# Patient Record
Sex: Male | Born: 1955 | Race: White | Hispanic: No | Smoking: Never smoker
Health system: Southern US, Community
[De-identification: ages and names within clinical notes are randomized; demographics above are authoritative.]

## PROBLEM LIST (undated history)

## (undated) DIAGNOSIS — M1990 Unspecified osteoarthritis, unspecified site: Secondary | ICD-10-CM

## (undated) DIAGNOSIS — K449 Diaphragmatic hernia without obstruction or gangrene: Secondary | ICD-10-CM

## (undated) DIAGNOSIS — E119 Type 2 diabetes mellitus without complications: Secondary | ICD-10-CM

## (undated) DIAGNOSIS — F419 Anxiety disorder, unspecified: Secondary | ICD-10-CM

## (undated) DIAGNOSIS — K225 Diverticulum of esophagus, acquired: Secondary | ICD-10-CM

## (undated) DIAGNOSIS — D509 Iron deficiency anemia, unspecified: Secondary | ICD-10-CM

## (undated) DIAGNOSIS — I10 Essential (primary) hypertension: Secondary | ICD-10-CM

## (undated) DIAGNOSIS — C801 Malignant (primary) neoplasm, unspecified: Secondary | ICD-10-CM

## (undated) DIAGNOSIS — K219 Gastro-esophageal reflux disease without esophagitis: Secondary | ICD-10-CM

## (undated) DIAGNOSIS — K5730 Diverticulosis of large intestine without perforation or abscess without bleeding: Secondary | ICD-10-CM

## (undated) DIAGNOSIS — B029 Zoster without complications: Secondary | ICD-10-CM

## (undated) DIAGNOSIS — E785 Hyperlipidemia, unspecified: Secondary | ICD-10-CM

## (undated) DIAGNOSIS — R7303 Prediabetes: Secondary | ICD-10-CM

## (undated) HISTORY — DX: Unspecified osteoarthritis, unspecified site: M19.90

## (undated) HISTORY — DX: Essential (primary) hypertension: I10

## (undated) HISTORY — DX: Diverticulum of esophagus, acquired: K22.5

## (undated) HISTORY — DX: Gastro-esophageal reflux disease without esophagitis: K21.9

## (undated) HISTORY — DX: Prediabetes: R73.03

## (undated) HISTORY — PX: COLONOSCOPY: SHX174

## (undated) HISTORY — DX: Diaphragmatic hernia without obstruction or gangrene: K44.9

## (undated) HISTORY — DX: Malignant (primary) neoplasm, unspecified: C80.1

## (undated) HISTORY — DX: Zoster without complications: B02.9

## (undated) HISTORY — DX: Diverticulosis of large intestine without perforation or abscess without bleeding: K57.30

## (undated) HISTORY — DX: Anxiety disorder, unspecified: F41.9

## (undated) HISTORY — DX: Iron deficiency anemia, unspecified: D50.9

## (undated) HISTORY — PX: ESOPHAGOGASTRODUODENOSCOPY: SHX1529

## (undated) HISTORY — PX: MEDIAL COLLATERAL LIGAMENT AND LATERAL COLLATERAL LIGAMENT REPAIR, KNEE: SHX2017

## (undated) HISTORY — DX: Hyperlipidemia, unspecified: E78.5

---

## 1998-05-09 ENCOUNTER — Ambulatory Visit (HOSPITAL_COMMUNITY): Admission: RE | Admit: 1998-05-09 | Discharge: 1998-05-09 | Payer: Self-pay | Admitting: *Deleted

## 1998-05-11 ENCOUNTER — Ambulatory Visit (HOSPITAL_COMMUNITY): Admission: RE | Admit: 1998-05-11 | Discharge: 1998-05-11 | Payer: Self-pay | Admitting: *Deleted

## 1998-09-27 ENCOUNTER — Ambulatory Visit (HOSPITAL_COMMUNITY): Admission: RE | Admit: 1998-09-27 | Discharge: 1998-09-27 | Payer: Self-pay | Admitting: Internal Medicine

## 1998-11-03 ENCOUNTER — Ambulatory Visit (HOSPITAL_COMMUNITY): Admission: RE | Admit: 1998-11-03 | Discharge: 1998-11-03 | Payer: Self-pay | Admitting: *Deleted

## 1998-11-03 ENCOUNTER — Encounter: Payer: Self-pay | Admitting: *Deleted

## 2000-06-12 ENCOUNTER — Ambulatory Visit (HOSPITAL_COMMUNITY): Admission: RE | Admit: 2000-06-12 | Discharge: 2000-06-12 | Payer: Self-pay | Admitting: *Deleted

## 2000-06-12 ENCOUNTER — Encounter: Payer: Self-pay | Admitting: *Deleted

## 2006-05-01 ENCOUNTER — Encounter: Payer: Self-pay | Admitting: *Deleted

## 2007-01-21 ENCOUNTER — Ambulatory Visit: Payer: Self-pay | Admitting: Gastroenterology

## 2007-01-21 LAB — CONVERTED CEMR LAB
Basophils Absolute: 0 10*3/uL (ref 0.0–0.1)
Basophils Relative: 0.7 % (ref 0.0–1.0)
Eosinophils Relative: 2.8 % (ref 0.0–5.0)
Folate: 7.7 ng/mL
HCT: 33.5 % — ABNORMAL LOW (ref 39.0–52.0)
Hemoglobin: 10.7 g/dL — ABNORMAL LOW (ref 13.0–17.0)
Iron: 18 ug/dL — ABNORMAL LOW (ref 42–165)
Lymphocytes Relative: 22.2 % (ref 12.0–46.0)
MCHC: 32 g/dL (ref 30.0–36.0)
MCV: 69.6 fL — ABNORMAL LOW (ref 78.0–100.0)
Monocytes Absolute: 0.7 10*3/uL (ref 0.2–0.7)
Monocytes Relative: 13.5 % — ABNORMAL HIGH (ref 3.0–11.0)
Neutro Abs: 3.1 10*3/uL (ref 1.4–7.7)
Neutrophils Relative %: 60.8 % (ref 43.0–77.0)
Platelets: 320 10*3/uL (ref 150–400)
RBC: 4.81 M/uL (ref 4.22–5.81)
RDW: 16.7 % — ABNORMAL HIGH (ref 11.5–14.6)
Tissue Transglutaminase Ab, IgA: <3 units (ref <5)
Vitamin B-12: 267 pg/mL (ref 211–911)
WBC: 5 10*3/uL (ref 4.5–10.5)

## 2007-01-22 ENCOUNTER — Encounter (INDEPENDENT_AMBULATORY_CARE_PROVIDER_SITE_OTHER): Payer: Self-pay | Admitting: Specialist

## 2007-01-22 ENCOUNTER — Ambulatory Visit: Payer: Self-pay | Admitting: Gastroenterology

## 2007-01-24 ENCOUNTER — Ambulatory Visit: Payer: Self-pay | Admitting: Gastroenterology

## 2007-01-24 LAB — CONVERTED CEMR LAB
Basophils Relative: 0.9 % (ref 0.0–1.0)
Monocytes Relative: 14.8 % — ABNORMAL HIGH (ref 3.0–11.0)
Neutro Abs: 2 10*3/uL (ref 1.4–7.7)
Platelets: 307 10*3/uL (ref 150–400)
RDW: 17 % — ABNORMAL HIGH (ref 11.5–14.6)

## 2007-02-03 ENCOUNTER — Ambulatory Visit: Payer: Self-pay | Admitting: Gastroenterology

## 2007-02-03 LAB — CONVERTED CEMR LAB
OCCULT 1: NEGATIVE
OCCULT 2: NEGATIVE
OCCULT 4: NEGATIVE
OCCULT 5: NEGATIVE

## 2007-02-14 ENCOUNTER — Ambulatory Visit: Payer: Self-pay | Admitting: Gastroenterology

## 2007-02-14 LAB — CONVERTED CEMR LAB
Basophils Absolute: 0 10*3/uL (ref 0.0–0.1)
Eosinophils Absolute: 0.1 10*3/uL (ref 0.0–0.6)
Hemoglobin: 12.4 g/dL — ABNORMAL LOW (ref 13.0–17.0)
MCHC: 33.2 g/dL (ref 30.0–36.0)
MCV: 74.6 fL — ABNORMAL LOW (ref 78.0–100.0)
Monocytes Absolute: 0.3 10*3/uL (ref 0.2–0.7)
Monocytes Relative: 10.8 % (ref 3.0–11.0)
RDW: 25.9 % — ABNORMAL HIGH (ref 11.5–14.6)

## 2007-02-20 ENCOUNTER — Ambulatory Visit: Payer: Self-pay | Admitting: Gastroenterology

## 2007-04-17 ENCOUNTER — Ambulatory Visit: Payer: Self-pay | Admitting: Gastroenterology

## 2007-04-17 LAB — CONVERTED CEMR LAB
Ferritin: 19.6 ng/mL — ABNORMAL LOW (ref 22.0–322.0)
Iron: 50 ug/dL (ref 42–165)

## 2007-04-29 ENCOUNTER — Ambulatory Visit: Payer: Self-pay | Admitting: Gastroenterology

## 2008-01-08 ENCOUNTER — Ambulatory Visit: Payer: Self-pay | Admitting: Gastroenterology

## 2008-01-08 LAB — CONVERTED CEMR LAB
A-1 Antitrypsin, Ser: 141 mg/dL (ref 83–200)
Anti Nuclear Antibody(ANA): NEGATIVE
Ceruloplasmin: 42 mg/dL (ref 21–63)
HCV Ab: NEGATIVE
Hepatitis B Surface Ag: NEGATIVE

## 2008-01-12 ENCOUNTER — Ambulatory Visit (HOSPITAL_COMMUNITY): Admission: RE | Admit: 2008-01-12 | Discharge: 2008-01-12 | Payer: Self-pay | Admitting: Gastroenterology

## 2008-01-14 ENCOUNTER — Ambulatory Visit: Payer: Self-pay | Admitting: Gastroenterology

## 2008-01-23 ENCOUNTER — Ambulatory Visit: Payer: Self-pay | Admitting: Internal Medicine

## 2008-03-01 ENCOUNTER — Ambulatory Visit: Payer: Self-pay | Admitting: Hematology & Oncology

## 2008-04-29 ENCOUNTER — Ambulatory Visit: Payer: Self-pay | Admitting: Hematology & Oncology

## 2008-04-29 LAB — CBC & DIFF AND RETIC
Basophils Absolute: 0 10*3/uL (ref 0.0–0.1)
Eosinophils Absolute: 0 10*3/uL (ref 0.0–0.5)
HCT: 30.8 % — ABNORMAL LOW (ref 38.7–49.9)
HGB: 9.8 g/dL — ABNORMAL LOW (ref 13.0–17.1)
IRF: 0.5 — ABNORMAL HIGH (ref 0.070–0.380)
MCH: 20.6 pg — ABNORMAL LOW (ref 28.0–33.4)
MONO#: 0.8 10*3/uL (ref 0.1–0.9)
NEUT#: 3.3 10*3/uL (ref 1.5–6.5)
NEUT%: 63 % (ref 40.0–75.0)
WBC: 5.3 10*3/uL (ref 4.0–10.0)
lymph#: 1.1 10*3/uL (ref 0.9–3.3)

## 2008-04-29 LAB — CHCC SMEAR

## 2008-05-01 LAB — COMPREHENSIVE METABOLIC PANEL
ALT: 35 U/L (ref 0–53)
AST: 25 U/L (ref 0–37)
CO2: 25 mEq/L (ref 19–32)
Calcium: 9.1 mg/dL (ref 8.4–10.5)
Chloride: 105 mEq/L (ref 96–112)
Potassium: 3.8 mEq/L (ref 3.5–5.3)
Sodium: 140 mEq/L (ref 135–145)
Total Protein: 7.3 g/dL (ref 6.0–8.3)

## 2008-06-15 ENCOUNTER — Ambulatory Visit: Payer: Self-pay | Admitting: Hematology & Oncology

## 2008-06-17 ENCOUNTER — Encounter: Payer: Self-pay | Admitting: Gastroenterology

## 2008-06-17 LAB — CBC & DIFF AND RETIC
Basophils Absolute: 0 10*3/uL (ref 0.0–0.1)
EOS%: 0.8 % (ref 0.0–7.0)
HGB: 13.1 g/dL (ref 13.0–17.1)
MCH: 24.3 pg — ABNORMAL LOW (ref 28.0–33.4)
MCV: 73.9 fL — ABNORMAL LOW (ref 81.6–98.0)
MONO%: 13.5 % — ABNORMAL HIGH (ref 0.0–13.0)
RBC: 5.38 10*6/uL (ref 4.20–5.71)
RDW: 33.3 % — ABNORMAL HIGH (ref 11.2–14.6)
RETIC #: 56.5 10*3/uL (ref 31.8–103.9)

## 2008-06-17 LAB — CHCC SMEAR

## 2009-06-24 ENCOUNTER — Ambulatory Visit: Payer: Self-pay | Admitting: Hematology & Oncology

## 2009-06-27 LAB — CBC WITH DIFFERENTIAL (CANCER CENTER ONLY)
BASO%: 0.6 % (ref 0.0–2.0)
EOS%: 4.6 % (ref 0.0–7.0)
HCT: 43 % (ref 38.7–49.9)
LYMPH%: 25.7 % (ref 14.0–48.0)
MCH: 28.3 pg (ref 28.0–33.4)
MCHC: 33.1 g/dL (ref 32.0–35.9)
MCV: 86 fL (ref 82–98)
NEUT%: 57.3 % (ref 40.0–80.0)
RDW: 13.2 % (ref 10.5–14.6)

## 2009-06-27 LAB — RETICULOCYTES (CHCC): Retic Ct Pct: 0.8 % (ref 0.4–3.1)

## 2009-11-21 DIAGNOSIS — D509 Iron deficiency anemia, unspecified: Secondary | ICD-10-CM | POA: Insufficient documentation

## 2009-11-21 DIAGNOSIS — E782 Mixed hyperlipidemia: Secondary | ICD-10-CM

## 2009-11-21 DIAGNOSIS — K5730 Diverticulosis of large intestine without perforation or abscess without bleeding: Secondary | ICD-10-CM | POA: Insufficient documentation

## 2009-11-21 DIAGNOSIS — K449 Diaphragmatic hernia without obstruction or gangrene: Secondary | ICD-10-CM | POA: Insufficient documentation

## 2009-11-21 DIAGNOSIS — K219 Gastro-esophageal reflux disease without esophagitis: Secondary | ICD-10-CM

## 2009-11-22 ENCOUNTER — Ambulatory Visit: Payer: Self-pay | Admitting: Gastroenterology

## 2010-08-18 ENCOUNTER — Ambulatory Visit: Payer: Self-pay | Admitting: Hematology & Oncology

## 2010-08-21 LAB — CBC WITH DIFFERENTIAL (CANCER CENTER ONLY)
BASO%: 0.3 % (ref 0.0–2.0)
Eosinophils Absolute: 0.1 10*3/uL (ref 0.0–0.5)
HCT: 37.5 % — ABNORMAL LOW (ref 38.7–49.9)
LYMPH%: 30.4 % (ref 14.0–48.0)
MCH: 24.7 pg — ABNORMAL LOW (ref 28.0–33.4)
MCV: 76 fL — ABNORMAL LOW (ref 82–98)
MONO%: 7.5 % (ref 0.0–13.0)
NEUT%: 59 % (ref 40.0–80.0)
Platelets: 207 10*3/uL (ref 145–400)
RDW: 14.7 % — ABNORMAL HIGH (ref 10.5–14.6)

## 2010-08-21 LAB — CHCC SATELLITE - SMEAR

## 2010-08-22 LAB — IRON AND TIBC
%SAT: 5 % — ABNORMAL LOW (ref 20–55)
TIBC: 441 ug/dL — ABNORMAL HIGH (ref 215–435)
UIBC: 417 ug/dL

## 2010-08-22 LAB — FERRITIN: Ferritin: 9 ng/mL — ABNORMAL LOW (ref 22–322)

## 2010-09-21 ENCOUNTER — Ambulatory Visit: Payer: Self-pay | Admitting: Hematology & Oncology

## 2010-09-25 LAB — CBC WITH DIFFERENTIAL (CANCER CENTER ONLY)
BASO%: 0.9 % (ref 0.0–2.0)
EOS%: 2.6 % (ref 0.0–7.0)
HCT: 46.7 % (ref 38.7–49.9)
LYMPH%: 23.4 % (ref 14.0–48.0)
MCH: 27.3 pg — ABNORMAL LOW (ref 28.0–33.4)
MCHC: 32.5 g/dL (ref 32.0–35.9)
MCV: 84 fL (ref 82–98)
MONO%: 12.8 % (ref 0.0–13.0)
NEUT%: 60.3 % (ref 40.0–80.0)
Platelets: 204 10*3/uL (ref 145–400)
RDW: 19.7 % — ABNORMAL HIGH (ref 10.5–14.6)
WBC: 4.9 10*3/uL (ref 4.0–10.0)

## 2010-09-25 LAB — RETICULOCYTES (CHCC)
RBC.: 5.42 MIL/uL (ref 4.22–5.81)
Retic Ct Pct: 0.9 % (ref 0.4–3.1)

## 2010-09-25 LAB — CHCC SATELLITE - SMEAR

## 2010-12-04 ENCOUNTER — Encounter: Admission: RE | Admit: 2010-12-04 | Discharge: 2010-12-04 | Payer: Self-pay | Admitting: Orthopedic Surgery

## 2010-12-07 ENCOUNTER — Encounter
Admission: RE | Admit: 2010-12-07 | Discharge: 2010-12-14 | Payer: Self-pay | Source: Home / Self Care | Attending: Orthopedic Surgery | Admitting: Orthopedic Surgery

## 2011-01-23 ENCOUNTER — Encounter
Admission: RE | Admit: 2011-01-23 | Discharge: 2011-01-30 | Payer: Self-pay | Source: Home / Self Care | Attending: Orthopedic Surgery | Admitting: Orthopedic Surgery

## 2011-02-01 ENCOUNTER — Ambulatory Visit: Payer: Federal, State, Local not specified - PPO | Attending: Family Medicine | Admitting: Rehabilitation

## 2011-02-01 DIAGNOSIS — M25669 Stiffness of unspecified knee, not elsewhere classified: Secondary | ICD-10-CM | POA: Insufficient documentation

## 2011-02-01 DIAGNOSIS — R262 Difficulty in walking, not elsewhere classified: Secondary | ICD-10-CM | POA: Insufficient documentation

## 2011-02-01 DIAGNOSIS — IMO0001 Reserved for inherently not codable concepts without codable children: Secondary | ICD-10-CM | POA: Insufficient documentation

## 2011-02-01 DIAGNOSIS — M25569 Pain in unspecified knee: Secondary | ICD-10-CM | POA: Insufficient documentation

## 2011-02-06 ENCOUNTER — Ambulatory Visit: Payer: Federal, State, Local not specified - PPO | Admitting: Physical Therapy

## 2011-02-09 ENCOUNTER — Ambulatory Visit: Payer: Federal, State, Local not specified - PPO

## 2011-02-13 ENCOUNTER — Ambulatory Visit: Payer: Federal, State, Local not specified - PPO | Admitting: Physical Therapy

## 2011-02-15 ENCOUNTER — Encounter: Payer: Federal, State, Local not specified - PPO | Admitting: Physical Therapy

## 2011-02-15 ENCOUNTER — Ambulatory Visit: Payer: Federal, State, Local not specified - PPO | Admitting: Physical Therapy

## 2011-02-20 ENCOUNTER — Ambulatory Visit: Payer: Federal, State, Local not specified - PPO | Admitting: Physical Therapy

## 2011-02-22 ENCOUNTER — Ambulatory Visit: Payer: Federal, State, Local not specified - PPO | Admitting: Physical Therapy

## 2011-02-26 ENCOUNTER — Ambulatory Visit: Payer: Federal, State, Local not specified - PPO | Admitting: Physical Therapy

## 2011-03-01 ENCOUNTER — Ambulatory Visit: Payer: Federal, State, Local not specified - PPO | Attending: Orthopedic Surgery | Admitting: Rehabilitation

## 2011-03-01 DIAGNOSIS — R262 Difficulty in walking, not elsewhere classified: Secondary | ICD-10-CM | POA: Insufficient documentation

## 2011-03-01 DIAGNOSIS — M25569 Pain in unspecified knee: Secondary | ICD-10-CM | POA: Insufficient documentation

## 2011-03-01 DIAGNOSIS — IMO0001 Reserved for inherently not codable concepts without codable children: Secondary | ICD-10-CM | POA: Insufficient documentation

## 2011-03-01 DIAGNOSIS — M25669 Stiffness of unspecified knee, not elsewhere classified: Secondary | ICD-10-CM | POA: Insufficient documentation

## 2011-03-06 ENCOUNTER — Ambulatory Visit: Payer: Federal, State, Local not specified - PPO | Admitting: Physical Therapy

## 2011-03-12 ENCOUNTER — Encounter (HOSPITAL_BASED_OUTPATIENT_CLINIC_OR_DEPARTMENT_OTHER): Payer: Federal, State, Local not specified - PPO | Admitting: Hematology & Oncology

## 2011-03-12 ENCOUNTER — Other Ambulatory Visit: Payer: Self-pay | Admitting: Hematology & Oncology

## 2011-03-12 ENCOUNTER — Ambulatory Visit: Payer: Federal, State, Local not specified - PPO | Admitting: Physical Therapy

## 2011-03-12 DIAGNOSIS — D509 Iron deficiency anemia, unspecified: Secondary | ICD-10-CM

## 2011-03-12 LAB — CBC WITH DIFFERENTIAL (CANCER CENTER ONLY)
BASO#: 0 10*3/uL (ref 0.0–0.2)
EOS%: 2.3 % (ref 0.0–7.0)
Eosinophils Absolute: 0.1 10*3/uL (ref 0.0–0.5)
HCT: 43.7 % (ref 38.7–49.9)
HGB: 14.7 g/dL (ref 13.0–17.1)
MCH: 28.3 pg (ref 28.0–33.4)
MCHC: 33.6 g/dL (ref 32.0–35.9)
MCV: 84 fL (ref 82–98)
MONO%: 13.3 % — ABNORMAL HIGH (ref 0.0–13.0)
NEUT%: 66.9 % (ref 40.0–80.0)
RBC: 5.19 10*6/uL (ref 4.20–5.70)

## 2011-03-12 LAB — RETICULOCYTES (CHCC): Retic Ct Pct: 0.9 % (ref 0.4–3.1)

## 2011-03-15 ENCOUNTER — Encounter (HOSPITAL_BASED_OUTPATIENT_CLINIC_OR_DEPARTMENT_OTHER): Payer: Federal, State, Local not specified - PPO

## 2011-03-15 ENCOUNTER — Ambulatory Visit: Payer: Federal, State, Local not specified - PPO | Admitting: Physical Therapy

## 2011-03-15 DIAGNOSIS — D509 Iron deficiency anemia, unspecified: Secondary | ICD-10-CM

## 2011-03-19 ENCOUNTER — Ambulatory Visit: Payer: Federal, State, Local not specified - PPO | Admitting: Physical Therapy

## 2011-03-21 ENCOUNTER — Ambulatory Visit: Payer: Federal, State, Local not specified - PPO | Admitting: Physical Therapy

## 2011-05-15 NOTE — Assessment & Plan Note (Signed)
[facility]                         GASTROENTEROLOGY OFFICE NOTE   MATTIS, FEATHERLY                       MRN:          [id number]  DATE:01/08/2008                            DOB:          [birth year]    Kriston continues with periodic elevation of his liver function tests,  most likely from fatty infiltration of his liver, although he is on  Statins, is currently taking Crestor 10 mg a day.  He has no reflux  symptoms and is taking Prevacid 30 mg a day.  He has some intermittent  rectal bleeding with blood on his tissue but denies abdominal pain or  bowel irregularity.  He has been followed by Dr. Parke Simmers and has had  recurrent iron depletion, with a slight anemia and a hemoglobin of 11.0.  This was obtained on December 16, 2007.  Liver enzymes showed an SGOT of  46 and SGPT of 57, with otherwise normal liver function tests and  albumin of 4.8 grams percent.  His iron saturation was low at 3%.  The  patient is supposed to be on chronic iron therapy, which he does not  take.   His previous workup included colonoscopy two years ago by Dr. Elnoria Howard,  which is apparently normal.  I did endoscopy in January of 2008, which  showed some erosive esophagitis and a hiatal hernia.   The patient weighs 217 pounds with blood pressure 120/70, pulse was 68  and regular.  I could not appreciate stigmata of chronic liver disease.  There was no hepatosplenomegaly, abdominal masses or tenderness.  Inspection of the rectum showed some external hemorrhoidal tissue but no  fissure or fistula.  There was soft stool in the rectal vault that was  trace guaiac-positive.  There were no rectal masses or tenderness or  prostatic enlargement.   ASSESSMENT:  1. Chronic gastroesophageal reflux disease with need for Prevacid      therapy.  2. Iron deficiency anemia with again guaiac-positive stool - rule out      colonic polyposis.  3. Previous small-bowel biopsy did not show any  evidence of iron      malabsorption from celiac disease.   RECOMMENDATIONS:  1. Urged him to follow a reflux regime and to take Prevacid 30 mg      before breakfast each morning.  2. Repeat outpatient colonoscopy .  3. Prescribe repeat Tandem with multivitamins daily.  4. Other medications per Dr. Parke Simmers.  5. I have sent him by the lab for complete hepatic workup, in terms of      metabolic causes of      liver disease and viral serologies.  6. Will check upper abdominal ultrasound exam, per his elevated      enzymes.     Vania Rea. Jarold Motto, MD, Caleen Essex, FAGA  Electronically Signed    DRP/MedQ  DD: 01/08/2008  DT: 01/08/2008  Job #: [id number]   cc:   Renaye Rakers, M.D.

## 2011-05-15 NOTE — Assessment & Plan Note (Signed)
[facility]                         GASTROENTEROLOGY OFFICE NOTE   Nathan Meadows, Nathan Meadows                       MRN:          [id number]  DATE:01/23/2008                            DOB:          03-08-1956    PROCEDURE:  Small bowel capsule endoscopy.   INDICATIONS:  Heme-positive stool.  Iron-deficiency anemia.  EGD and  colonoscopy have been unrevealing.   FINDINGS:  Please see the computer-generated report with photos.   The small bowel capsule endoscopy shows rapid transit.  No abnormalities  on exam of the small intestine.  No cause of iron-deficiency anemia  or  heme-positive stool found.   PLAN:  Per Dr. Jarold Motto.     Nathan Boop, MD,FACG  Electronically Signed    CEG/MedQ  DD: 01/29/2008  DT: 01/30/2008  Job #: [id number]

## 2011-09-26 ENCOUNTER — Other Ambulatory Visit: Payer: Self-pay | Admitting: Hematology & Oncology

## 2011-09-26 ENCOUNTER — Encounter (HOSPITAL_BASED_OUTPATIENT_CLINIC_OR_DEPARTMENT_OTHER): Payer: Federal, State, Local not specified - PPO | Admitting: Hematology & Oncology

## 2011-09-26 DIAGNOSIS — D509 Iron deficiency anemia, unspecified: Secondary | ICD-10-CM

## 2011-09-26 LAB — CBC WITH DIFFERENTIAL (CANCER CENTER ONLY)
BASO%: 0.4 % (ref 0.0–2.0)
EOS%: 2.7 % (ref 0.0–7.0)
LYMPH#: 1.2 10*3/uL (ref 0.9–3.3)
MCH: 30.5 pg (ref 28.0–33.4)
MCHC: 35 g/dL (ref 32.0–35.9)
MONO%: 15.1 % — ABNORMAL HIGH (ref 0.0–13.0)
NEUT#: 2.8 10*3/uL (ref 1.5–6.5)
NEUT%: 57.5 % (ref 40.0–80.0)
RDW: 13.8 % (ref 11.1–15.7)

## 2011-09-26 LAB — RETICULOCYTES (CHCC)
RBC.: 5.21 MIL/uL (ref 4.22–5.81)
Retic Ct Pct: 1 % (ref 0.4–2.3)

## 2012-03-26 ENCOUNTER — Ambulatory Visit: Payer: Federal, State, Local not specified - PPO | Admitting: Hematology & Oncology

## 2012-03-26 ENCOUNTER — Other Ambulatory Visit (HOSPITAL_BASED_OUTPATIENT_CLINIC_OR_DEPARTMENT_OTHER): Payer: PRIVATE HEALTH INSURANCE | Admitting: Lab

## 2012-03-26 ENCOUNTER — Ambulatory Visit (HOSPITAL_BASED_OUTPATIENT_CLINIC_OR_DEPARTMENT_OTHER): Payer: PRIVATE HEALTH INSURANCE | Admitting: Hematology & Oncology

## 2012-03-26 ENCOUNTER — Other Ambulatory Visit: Payer: Federal, State, Local not specified - PPO | Admitting: Lab

## 2012-03-26 VITALS — BP 127/76 | HR 68 | Temp 98.5°F | Ht 72.0 in | Wt 226.0 lb

## 2012-03-26 DIAGNOSIS — D509 Iron deficiency anemia, unspecified: Secondary | ICD-10-CM

## 2012-03-26 LAB — CBC WITH DIFFERENTIAL (CANCER CENTER ONLY)
BASO%: 0.2 % (ref 0.0–2.0)
Eosinophils Absolute: 0.1 10*3/uL (ref 0.0–0.5)
HCT: 40.1 % (ref 38.7–49.9)
LYMPH%: 23.9 % (ref 14.0–48.0)
MCH: 26.4 pg — ABNORMAL LOW (ref 28.0–33.4)
MCV: 81 fL — ABNORMAL LOW (ref 82–98)
MONO%: 16.3 % — ABNORMAL HIGH (ref 0.0–13.0)
NEUT%: 57.5 % (ref 40.0–80.0)
Platelets: 220 10*3/uL (ref 145–400)
RDW: 15 % (ref 11.1–15.7)

## 2012-03-26 LAB — IRON AND TIBC: %SAT: 5 % — ABNORMAL LOW (ref 20–55)

## 2012-03-26 LAB — RETICULOCYTES (CHCC): Retic Ct Pct: 1.6 % (ref 0.4–2.3)

## 2012-03-26 NOTE — Progress Notes (Signed)
CC:   Renaye Rakers, M.D.  DIAGNOSIS:  Recurrent iron-deficiency anemia.  CURRENT THERAPY:  IV iron as indicated.  INTERIM HISTORY:  Mr. Brickey comes in for followup.  He is feeling well. His Feraheme was last given back in March 2012.  When we last saw him, I think, in September of last year, his ferritin was 18.  His hemoglobin was quite nice at 15.  He still feels okay.  He has not noticed any obvious bleeding.  He has had no nausea and vomiting.  He has had no fevers, sweats or chills.  He denies any kind of "cravings."  PHYSICAL EXAMINATION:  General:  This is a well-developed, well- nourished white gentleman in no obvious distress.  Vital Signs:  Show a temperature of 98.5, pulse 68, respiratory rate 16, blood pressure 127/76.  Weight is 226.  Head and Neck Exam:  Shows a normocephalic, atraumatic skull.  There are no ocular or oral lesions.  There are no palpable cervical or supraclavicular lymph nodes.  Lungs:  Clear bilaterally.  Cardiac Exam:  Regular rate and rhythm with normal S1 and S2.  There are no murmurs, rubs or bruits.  Abdominal Exam:  Soft with good bowel sounds.  There is no palpable abdominal mass.  There is no fluid wave.  There is no palpable hepatosplenomegaly.  Back Exam:  No tenderness over the spine, ribs or hips.  Extremities:  Show no clubbing, cyanosis, or edema.  Neurological Exam:  Shows no focal neurological deficits.  LABORATORY STUDIES:  White cell count is 5.3, hemoglobin 13, hematocrit 40, platelet count is 220.  MCV is 81.  IMPRESSION:  Mr. Verastegui is a 56 year old gentleman with recurrent iron- deficiency anemia.  He just really does not absorb iron well.  He has had a thorough GI workup.  I suppose he may have some GI blood loss through the small intestine, but again, he is asymptomatic with this.  I do think, however, that he will need a dose of IV iron.  His hemoglobin has dropped almost 3 points in 6 months.  Since he only likes to come  in twice a year, I think that we should go ahead and be proactive and give him a dose of Feraheme to get him through the spring and summer and then back to see [place] in October.  We will go ahead and give him the Feraheme next week.    ______________________________ Josph Macho, M.D. PRE/MEDQ  D:  03/26/2012  T:  03/26/2012  Job:  [id number]

## 2012-03-26 NOTE — Progress Notes (Signed)
This office note has been dictated.

## 2012-04-01 ENCOUNTER — Ambulatory Visit (HOSPITAL_BASED_OUTPATIENT_CLINIC_OR_DEPARTMENT_OTHER): Payer: PRIVATE HEALTH INSURANCE

## 2012-04-01 VITALS — BP 116/67 | HR 74 | Temp 97.0°F

## 2012-04-01 DIAGNOSIS — D509 Iron deficiency anemia, unspecified: Secondary | ICD-10-CM

## 2012-04-01 MED ORDER — FERUMOXYTOL INJECTION 510 MG/17 ML
1020.0000 mg | Freq: Once | INTRAVENOUS | Status: AC
  Administered 2012-04-01: 1020 mg via INTRAVENOUS
  Filled 2012-04-01: qty 34

## 2012-04-01 MED ORDER — SODIUM CHLORIDE 0.9 % IV SOLN
Freq: Once | INTRAVENOUS | Status: AC
  Administered 2012-04-01: 11:00:00 via INTRAVENOUS

## 2012-10-01 ENCOUNTER — Other Ambulatory Visit (HOSPITAL_BASED_OUTPATIENT_CLINIC_OR_DEPARTMENT_OTHER): Payer: PRIVATE HEALTH INSURANCE | Admitting: Lab

## 2012-10-01 ENCOUNTER — Ambulatory Visit (HOSPITAL_BASED_OUTPATIENT_CLINIC_OR_DEPARTMENT_OTHER): Payer: PRIVATE HEALTH INSURANCE | Admitting: Hematology & Oncology

## 2012-10-01 VITALS — BP 129/81 | HR 68 | Temp 98.0°F | Resp 20 | Ht 72.0 in | Wt 217.0 lb

## 2012-10-01 DIAGNOSIS — D509 Iron deficiency anemia, unspecified: Secondary | ICD-10-CM

## 2012-10-01 LAB — CBC WITH DIFFERENTIAL (CANCER CENTER ONLY)
BASO#: 0 10*3/uL (ref 0.0–0.2)
BASO%: 0.3 % (ref 0.0–2.0)
HCT: 37.9 % — ABNORMAL LOW (ref 38.7–49.9)
HGB: 12.4 g/dL — ABNORMAL LOW (ref 13.0–17.1)
LYMPH#: 1.2 10*3/uL (ref 0.9–3.3)
MONO#: 0.9 10*3/uL (ref 0.1–0.9)
NEUT%: 67.9 % (ref 40.0–80.0)
RBC: 4.89 10*6/uL (ref 4.20–5.70)
WBC: 6.7 10*3/uL (ref 4.0–10.0)

## 2012-10-01 LAB — IRON AND TIBC
TIBC: 484 ug/dL — ABNORMAL HIGH (ref 215–435)
UIBC: 459 ug/dL — ABNORMAL HIGH (ref 125–400)

## 2012-10-01 NOTE — Progress Notes (Signed)
This office note has been dictated.

## 2012-10-02 ENCOUNTER — Telehealth: Payer: Self-pay | Admitting: *Deleted

## 2012-10-02 ENCOUNTER — Other Ambulatory Visit: Payer: Self-pay | Admitting: *Deleted

## 2012-10-02 DIAGNOSIS — D509 Iron deficiency anemia, unspecified: Secondary | ICD-10-CM

## 2012-10-02 NOTE — Telephone Encounter (Signed)
Message copied by Anselm Jungling on Thu Oct 02, 2012  2:44 PM ------      Message from: Arlan Organ R      Created: Thu Oct 02, 2012  7:09 AM       Please call and let him know that his iron is low. Please set up IV iron withFeraheme next week. He will need 1020 mg dose. Thanks.  Cindee Lame

## 2012-10-02 NOTE — Progress Notes (Signed)
CC:   Renaye Rakers, M.D.  DIAGNOSIS:  Recurrent iron deficiency anemia.  CURRENT THERAPY:  IV iron as indicated; patient last received iron in April 2013.  INTERIM HISTORY:  He has had no problems bleeding.  He is retired now. He is enjoying his  retirement.  When we last saw him in March, his ferritin was only 14 with iron saturation of 5%.  Mr. Nathan Meadows just does not absorb iron well at all.  He has had no "cravings."  He is not chewing ice.  He has had no melena or bright red blood per rectum.  He has had no cough or shortness of breath.  He has not had any kind of rashes.  PHYSICAL EXAMINATION:  This is a well-developed, well-nourished white gentleman in no obvious distress.  Vital signs:  98.6, pulse 68, respiratory rate 20, blood pressure 149/81.  Weight is 217.  Head and neck:  Normocephalic, atraumatic skull.  There are no ocular or oral lesions.  There are no palpable cervical or supraclavicular lymph nodes. Lungs:  Clear bilaterally.  Cardiac:  Regular rate and rhythm with a normal S1 and S2.  There are no murmurs or rubs, or bruits.  Abdomen: Soft with good bowel sounds.  There is no palpable abdominal mass. There is no fluid wave.  No palpable hepatosplenomegaly.  Back: No tenderness over the spine, ribs, or hips.  Extremities:  No clubbing, cyanosis or edema.  Skin:  No rashes, ecchymosis or petechia.  LABORATORY STUDIES:  White cell count is 6.7, hemoglobin 12.4, hematocrit 37.9, platelet count 207.  MCV is 78.  IMPRESSION:  Mr. Ricciardelli is a nice 56 year old gentleman with iron deficiency anemia.  I suspect that he will need IV iron again.  His MCV is going down.  This, typically, is a good indicator for him.  Once we get his iron studies, we will see how much iron he needs and probably get him back in a week or so.  He is going to [place] in early November, so I want to make sure that his iron is restored before then.  I will plan to see back myself in 6 more  months.    ______________________________ Josph Macho, M.D. PRE/MEDQ  D:  10/01/2012  T:  10/02/2012  Job:  [id number]

## 2012-10-02 NOTE — Telephone Encounter (Signed)
Called patient and left message on personal cell phone to let him know that his iron levels were low and he needs IV Feraheme 1020 mg in next few weeks. Instructed patient to call our office to schedule this

## 2012-10-07 ENCOUNTER — Ambulatory Visit (HOSPITAL_BASED_OUTPATIENT_CLINIC_OR_DEPARTMENT_OTHER): Payer: PRIVATE HEALTH INSURANCE

## 2012-10-07 VITALS — BP 137/87 | HR 83 | Temp 98.0°F | Resp 18

## 2012-10-07 DIAGNOSIS — D509 Iron deficiency anemia, unspecified: Secondary | ICD-10-CM

## 2012-10-07 MED ORDER — SODIUM CHLORIDE 0.9 % IV SOLN
Freq: Once | INTRAVENOUS | Status: AC
  Administered 2012-10-07: 12:00:00 via INTRAVENOUS

## 2012-10-07 MED ORDER — SODIUM CHLORIDE 0.9 % IV SOLN
1020.0000 mg | Freq: Once | INTRAVENOUS | Status: AC
  Administered 2012-10-07: 1020 mg via INTRAVENOUS
  Filled 2012-10-07: qty 34

## 2013-04-01 ENCOUNTER — Ambulatory Visit (HOSPITAL_BASED_OUTPATIENT_CLINIC_OR_DEPARTMENT_OTHER): Payer: PRIVATE HEALTH INSURANCE | Admitting: Hematology & Oncology

## 2013-04-01 ENCOUNTER — Ambulatory Visit (HOSPITAL_BASED_OUTPATIENT_CLINIC_OR_DEPARTMENT_OTHER): Payer: PRIVATE HEALTH INSURANCE

## 2013-04-01 ENCOUNTER — Other Ambulatory Visit (HOSPITAL_BASED_OUTPATIENT_CLINIC_OR_DEPARTMENT_OTHER): Payer: PRIVATE HEALTH INSURANCE | Admitting: Lab

## 2013-04-01 VITALS — BP 122/72 | HR 63 | Temp 97.6°F | Resp 18 | Ht 72.0 in | Wt 221.0 lb

## 2013-04-01 DIAGNOSIS — D509 Iron deficiency anemia, unspecified: Secondary | ICD-10-CM

## 2013-04-01 LAB — CBC WITH DIFFERENTIAL (CANCER CENTER ONLY)
BASO#: 0 10*3/uL (ref 0.0–0.2)
EOS%: 3.2 % (ref 0.0–7.0)
HCT: 36.2 % — ABNORMAL LOW (ref 38.7–49.9)
HGB: 11.6 g/dL — ABNORMAL LOW (ref 13.0–17.1)
LYMPH#: 1 10*3/uL (ref 0.9–3.3)
MCH: 25 pg — ABNORMAL LOW (ref 28.0–33.4)
MCHC: 32 g/dL (ref 32.0–35.9)
NEUT%: 60.2 % (ref 40.0–80.0)

## 2013-04-01 LAB — FERRITIN: Ferritin: 6 ng/mL — ABNORMAL LOW (ref 22–322)

## 2013-04-01 LAB — IRON AND TIBC
%SAT: 4 % — ABNORMAL LOW (ref 20–55)
TIBC: 479 ug/dL — ABNORMAL HIGH (ref 215–435)

## 2013-04-01 MED ORDER — SODIUM CHLORIDE 0.9 % IV SOLN
1020.0000 mg | Freq: Once | INTRAVENOUS | Status: AC
  Administered 2013-04-01: 1020 mg via INTRAVENOUS
  Filled 2013-04-01: qty 34

## 2013-04-01 MED ORDER — SODIUM CHLORIDE 0.9 % IV SOLN
Freq: Once | INTRAVENOUS | Status: AC
  Administered 2013-04-01: 13:00:00 via INTRAVENOUS

## 2013-04-01 NOTE — Progress Notes (Signed)
This office note has been dictated.

## 2013-04-01 NOTE — Patient Instructions (Addendum)
Ferumoxytol injection What is this medicine? FERUMOXYTOL is an iron complex. Iron is used to make healthy red blood cells, which carry oxygen and nutrients throughout the body. This medicine is used to treat iron deficiency anemia in people with chronic kidney disease. This medicine may be used for other purposes; ask your health care provider or pharmacist if you have questions. What should I tell my health care provider before I take this medicine? They need to know if you have any of these conditions: -anemia not caused by low iron levels -high levels of iron in the blood -magnetic resonance imaging (MRI) test scheduled -an unusual or allergic reaction to iron, other medicines, foods, dyes, or preservatives -pregnant or trying to get pregnant -breast-feeding How should I use this medicine? This medicine is for infusion into a vein. It is given by a health care professional in a hospital or clinic setting. Talk to your pediatrician regarding the use of this medicine in children. Special care may be needed. Overdosage: If you think you've taken too much of this medicine contact a poison control center or emergency room at once. Overdosage: If you think you have taken too much of this medicine contact a poison control center or emergency room at once. NOTE: This medicine is only for you. Do not share this medicine with others. What if I miss a dose? It is important not to miss your dose. Call your doctor or health care professional if you are unable to keep an appointment. What may interact with this medicine? This medicine may interact with the following medications: -other iron products This list may not describe all possible interactions. Give your health care provider a list of all the medicines, herbs, non-prescription drugs, or dietary supplements you use. Also tell them if you smoke, drink alcohol, or use illegal drugs. Some items may interact with your medicine. What should I watch  for while using this medicine? Visit your doctor or healthcare professional regularly. Tell your doctor or healthcare professional if your symptoms do not start to get better or if they get worse. You may need blood work done while you are taking this medicine. You may need to follow a special diet. Talk to your doctor. Foods that contain iron include: whole grains/cereals, dried fruits, beans, or peas, leafy green vegetables, and organ meats (liver, kidney). What side effects may I notice from receiving this medicine? Side effects that you should report to your doctor or health care professional as soon as possible: -allergic reactions like skin rash, itching or hives, swelling of the face, lips, or tongue -breathing problems -changes in blood pressure -feeling faint or lightheaded, falls -fever or chills -flushing, sweating, or hot feelings -swelling of the ankles or feet Side effects that usually do not require medical attention (Report these to your doctor or health care professional if they continue or are bothersome.): -diarrhea -headache -nausea, vomiting -stomach pain This list may not describe all possible side effects. Call your doctor for medical advice about side effects. You may report side effects to FDA at 1-800-FDA-1088. Where should I keep my medicine? This drug is given in a hospital or clinic and will not be stored at home. NOTE: This sheet is a summary. It may not cover all possible information. If you have questions about this medicine, talk to your doctor, pharmacist, or health care provider.  2013, Elsevier/Gold Standard. (09/08/2008 9:48:25 PM)  

## 2013-04-02 NOTE — Progress Notes (Signed)
CC:   Nathan Meadows, M.D.  DIAGNOSIS:  Recurrent iron-deficiency anemia.  CURRENT THERAPY:  IV iron as indicated.  The patient will receive a dose today.  INTERIM HISTORY:  Nathan Meadows comes in for his followup.  He is doing quite well.  He has had no complaints since we last saw him.  He may feel a little bit more tired.  When we last saw him, his iron studies showed a ferritin of 13 with an iron saturation of 5%.  We did go ahead and give him a dose of iron back in October 2013.  He has not noticed any bleeding.  He does have some hemorrhoids.  He has had no swelling in his legs.  He has had no rashes.  PHYSICAL EXAMINATION:  General:  This is a well-developed, well- nourished white gentleman in no obvious distress.  Vital signs: Temperature of 97.6, pulse 63, respiratory rate 18, blood pressure 123/72.  Weight is 321.  Head and neck:  Normocephalic, atraumatic skull.  There are no ocular or oral lesions.  There are no palpable cervical or supraclavicular lymph nodes.  Lungs:  Clear bilaterally. Cardiac:  Regular rate and rhythm with a normal S1 and S2.  There are no murmurs, rubs, or bruits.  Abdomen:  Soft with good bowel sounds.  There is no palpable abdominal mass.  There is no fluid wave.  There is no palpable hepatosplenomegaly.  Back:  No tenderness over the spine, ribs, or hips.  Extremities:  No clubbing, cyanosis, or edema.  Neurological: No focal neurological deficits.  LABORATORY STUDIES:  White cell count 4.7, hemoglobin 11.6, hematocrit 36.2, platelet count 216.  MCV is 78.  IMPRESSION:  Nathan Meadows is a 57 year old gentleman with recurrent iron- deficiency anemia.  Again, he does not have any kind of bleeding issues. He just does not absorb iron.  We will go ahead and give him a dose of iron today.  I will give him a dose of Feraheme at 1020 mg.  We will get him back in another 6 months.  This typically works well  for him.    ______________________________ Nathan Meadows, M.D. PRE/MEDQ  D:  04/01/2013  T:  04/02/2013  Job:  [id number]

## 2013-10-01 ENCOUNTER — Ambulatory Visit: Payer: PRIVATE HEALTH INSURANCE

## 2013-10-01 ENCOUNTER — Ambulatory Visit (HOSPITAL_BASED_OUTPATIENT_CLINIC_OR_DEPARTMENT_OTHER): Payer: PRIVATE HEALTH INSURANCE | Admitting: Hematology & Oncology

## 2013-10-01 ENCOUNTER — Other Ambulatory Visit (HOSPITAL_BASED_OUTPATIENT_CLINIC_OR_DEPARTMENT_OTHER): Payer: PRIVATE HEALTH INSURANCE | Admitting: Lab

## 2013-10-01 VITALS — BP 140/75 | HR 62 | Temp 98.7°F | Resp 18 | Ht 72.0 in | Wt 214.0 lb

## 2013-10-01 DIAGNOSIS — D509 Iron deficiency anemia, unspecified: Secondary | ICD-10-CM

## 2013-10-01 LAB — CHCC SATELLITE - SMEAR

## 2013-10-01 LAB — CBC WITH DIFFERENTIAL (CANCER CENTER ONLY)
BASO%: 0.6 % (ref 0.0–2.0)
HCT: 42.2 % (ref 38.7–49.9)
LYMPH#: 1 10*3/uL (ref 0.9–3.3)
MONO#: 0.8 10*3/uL (ref 0.1–0.9)
Platelets: 220 10*3/uL (ref 145–400)
RDW: 15.2 % (ref 11.1–15.7)
WBC: 4.9 10*3/uL (ref 4.0–10.0)

## 2013-10-01 LAB — IRON AND TIBC CHCC: Iron: 29 ug/dL — ABNORMAL LOW (ref 42–163)

## 2013-10-01 NOTE — Progress Notes (Signed)
This office note has been dictated.

## 2013-10-02 NOTE — Progress Notes (Signed)
CC:   Renaye Rakers, M.D.  DIAGNOSIS:  Recurrent iron deficiency anemia.  CURRENT THERAPY:  IV iron as indicated.  However, the patient last received a dose on April 01, 2013.  INTERIM HISTORY:  Mr. Vandevoorde point comes in for followup.  He always does well with IV iron.  When we last saw him in April, his ferritin was 6 with an iron saturation of 4%.  He got Feraheme at a dose of 1020 mg. As always, he responded well to this.  He feels well.  He has had a good summer.  He has been playing a lot of golf.  He has had no problems with nausea or vomiting.  He has had no headache. There has been no bleeding or bruising.  He has had no melena or bright red blood per rectum.  Overall, his perform his performance status is ECOG 0.  PHYSICAL EXAMINATION:  General:  This is a well-developed, well- nourished white gentleman in no obvious distress.  Vital signs: Temperature of 98.7, pulse 62, respiratory rate 18, blood pressure 140/75.  Weight is 214 pounds.  Head and neck:  Normocephalic, atraumatic skull.  There are no ocular or oral lesions.  There are no palpable cervical or supraclavicular lymph nodes.  Lungs:  Clear bilaterally.  Cardiac:  Regular rate and rhythm with a normal S1 and S2. No murmurs, rubs or bruits.  Abdomen:  Soft.  He has good bowel sounds. There is no fluid wave.  There is no palpable abdominal mass.  There is no palpable hepatosplenomegaly.  Extremities:  Show no clubbing, cyanosis or edema.  Skin:  No rash, ecchymosis, or petechia.  LABORATORY STUDIES:  White cell count 4.9, hemoglobin 14, hematocrit 42.2, platelet count 220,000.  MCV is 84.  Peripheral smear shows improved red cell size and shape.  Red cells now are normochromic and normocytic.  White cells appear normal without hypersegmented polys.  Platelets are adequate in number and size.  IMPRESSION:  Mr. Nicasio is a 57 year old gentleman with recurrent iron deficiency anemia.  He is doing quite well.  He  has had no complaints at all.  He has responded as I expected.  We will plan to get him back in 6 more months.  I do not see a need for any blood work in between visits, unless he has clinical issues.    ______________________________ Josph Macho, M.D. PRE/MEDQ  D:  10/01/2013  T:  10/02/2013  Job:  [id number]

## 2013-10-06 ENCOUNTER — Telehealth: Payer: Self-pay | Admitting: *Deleted

## 2013-10-06 NOTE — Telephone Encounter (Addendum)
Message copied by Mirian Capuchin on Tue Oct 06, 2013  1:54 PM ------      Message from: Arlan Organ R      Created: Thu Oct 01, 2013  6:53 PM       Call - surprisingly his iron is still low!!  I want to I want to give a dose of Feraheme 1020mg  in order to prevent his blood from going down and getting him thru until next March!!!  Pete ------This message left on pt's home phone.  Asked that he call [facility] and schedule this appt.

## 2013-10-07 ENCOUNTER — Ambulatory Visit (HOSPITAL_BASED_OUTPATIENT_CLINIC_OR_DEPARTMENT_OTHER): Payer: PRIVATE HEALTH INSURANCE

## 2013-10-07 VITALS — BP 129/85 | HR 78 | Temp 98.5°F | Resp 20

## 2013-10-07 DIAGNOSIS — D509 Iron deficiency anemia, unspecified: Secondary | ICD-10-CM

## 2013-10-07 MED ORDER — SODIUM CHLORIDE 0.9 % IV SOLN
1020.0000 mg | Freq: Once | INTRAVENOUS | Status: AC
  Administered 2013-10-07: 1020 mg via INTRAVENOUS
  Filled 2013-10-07: qty 34

## 2013-10-07 NOTE — Patient Instructions (Signed)
Ferumoxytol injection What is this medicine? FERUMOXYTOL is an iron complex. Iron is used to make healthy red blood cells, which carry oxygen and nutrients throughout the body. This medicine is used to treat iron deficiency anemia in people with chronic kidney disease. This medicine may be used for other purposes; ask your health care provider or pharmacist if you have questions. What should I tell my health care provider before I take this medicine? They need to know if you have any of these conditions: -anemia not caused by low iron levels -high levels of iron in the blood -magnetic resonance imaging (MRI) test scheduled -an unusual or allergic reaction to iron, other medicines, foods, dyes, or preservatives -pregnant or trying to get pregnant -breast-feeding How should I use this medicine? This medicine is for infusion into a vein. It is given by a health care professional in a hospital or clinic setting. Talk to your pediatrician regarding the use of this medicine in children. Special care may be needed. Overdosage: If you think you've taken too much of this medicine contact a poison control center or emergency room at once. Overdosage: If you think you have taken too much of this medicine contact a poison control center or emergency room at once. NOTE: This medicine is only for you. Do not share this medicine with others. What if I miss a dose? It is important not to miss your dose. Call your doctor or health care professional if you are unable to keep an appointment. What may interact with this medicine? This medicine may interact with the following medications: -other iron products This list may not describe all possible interactions. Give your health care provider a list of all the medicines, herbs, non-prescription drugs, or dietary supplements you use. Also tell them if you smoke, drink alcohol, or use illegal drugs. Some items may interact with your medicine. What should I watch  for while using this medicine? Visit your doctor or healthcare professional regularly. Tell your doctor or healthcare professional if your symptoms do not start to get better or if they get worse. You may need blood work done while you are taking this medicine. You may need to follow a special diet. Talk to your doctor. Foods that contain iron include: whole grains/cereals, dried fruits, beans, or peas, leafy green vegetables, and organ meats (liver, kidney). What side effects may I notice from receiving this medicine? Side effects that you should report to your doctor or health care professional as soon as possible: -allergic reactions like skin rash, itching or hives, swelling of the face, lips, or tongue -breathing problems -changes in blood pressure -feeling faint or lightheaded, falls -fever or chills -flushing, sweating, or hot feelings -swelling of the ankles or feet Side effects that usually do not require medical attention (Report these to your doctor or health care professional if they continue or are bothersome.): -diarrhea -headache -nausea, vomiting -stomach pain This list may not describe all possible side effects. Call your doctor for medical advice about side effects. You may report side effects to FDA at 1-800-FDA-1088. Where should I keep my medicine? This drug is given in a hospital or clinic and will not be stored at home. NOTE: This sheet is a summary. It may not cover all possible information. If you have questions about this medicine, talk to your doctor, pharmacist, or health care provider.  2012, Elsevier/Gold Standard. (09/08/2008 9:48:25 PM) 

## 2013-12-28 DIAGNOSIS — B029 Zoster without complications: Secondary | ICD-10-CM | POA: Insufficient documentation

## 2014-01-18 ENCOUNTER — Encounter: Payer: Self-pay | Admitting: Gastroenterology

## 2014-01-22 ENCOUNTER — Ambulatory Visit: Payer: PRIVATE HEALTH INSURANCE | Admitting: Gastroenterology

## 2014-01-26 ENCOUNTER — Ambulatory Visit: Payer: PRIVATE HEALTH INSURANCE | Admitting: Gastroenterology

## 2014-01-27 ENCOUNTER — Encounter: Payer: Self-pay | Admitting: *Deleted

## 2014-02-02 ENCOUNTER — Encounter: Payer: Self-pay | Admitting: Gastroenterology

## 2014-02-02 ENCOUNTER — Ambulatory Visit (INDEPENDENT_AMBULATORY_CARE_PROVIDER_SITE_OTHER): Payer: PRIVATE HEALTH INSURANCE | Admitting: Gastroenterology

## 2014-02-02 VITALS — BP 122/82 | HR 68 | Ht 71.0 in | Wt 217.2 lb

## 2014-02-02 DIAGNOSIS — K625 Hemorrhage of anus and rectum: Secondary | ICD-10-CM

## 2014-02-02 DIAGNOSIS — K921 Melena: Secondary | ICD-10-CM

## 2014-02-02 DIAGNOSIS — R1319 Other dysphagia: Secondary | ICD-10-CM

## 2014-02-02 DIAGNOSIS — D509 Iron deficiency anemia, unspecified: Secondary | ICD-10-CM

## 2014-02-02 DIAGNOSIS — K219 Gastro-esophageal reflux disease without esophagitis: Secondary | ICD-10-CM

## 2014-02-02 MED ORDER — NA SULFATE-K SULFATE-MG SULF 17.5-3.13-1.6 GM/177ML PO SOLN
ORAL | Status: DC
Start: 2014-02-02 — End: 2014-02-05

## 2014-02-02 NOTE — Patient Instructions (Signed)

## 2014-02-02 NOTE — Progress Notes (Signed)
History of Present Illness:  This is a 58 year old Caucasian male with unexplained iron deficiency anemia and guaiac positive stools.  In 2008 in 2009 he underwent endoscopy and was found to have acid reflux, and he is been on pantoprazole daily since that time without symptoms except for some recent intermittent solid food dysphagia.  He denies acid reflux symptoms, dyspepsia, but has had some recent rectal bleeding mostly hemorrhoidal in nature with blood dripping in the toilet water.  He denies lower abdominal pain, melena, nausea vomiting or any systemic complaints.  He sees Dr. Marin Olp in hematology and received iron infusions every 6 months.  Family history is noncontributory.  He follows a regular diet said no anorexia or weight loss.  He is recently recovered from having a bout of shingles in his right upper quadrant area and still has some dull discomfort.  He otherwise is healthy and denies chronic medical problems.  Colonoscopy and endoscopy and pill camera exam all normal in 2009.  I have reviewed this patient's present history, medical and surgical past history, allergies and medications.     ROS:   All systems were reviewed and are negative unless otherwise stated in the HPI.    Physical Exam: Blood pressure 122/82, pulse 60 and regular, and weight 217 pounds.  BMI is 30.31. General well developed well nourished patient in no acute distress, appearing their stated age Eyes PERRLA, no icterus, fundoscopic exam per opthamologist Skin no lesions noted Neck supple, no adenopathy, no thyroid enlargement, no tenderness Chest clear to percussion and auscultation Heart no significant murmurs, gallops or rubs noted Abdomen no hepatosplenomegaly masses or tenderness, BS normal.  The macular hyperpigmented areas right upper quadrant but no vesicular lesions or evidence of active herpetic rash. Rectal inspection normal no fissures, or fistulae noted.  No masses or tenderness on digital exam. Stool  guaiac negative.  There is no external posterior skin tag visible.  Rectal exam shows no masses, tenderness, or prostate enlargement.  There is soft normal color stool which is guaiac positive. Extremities no acute joint lesions, edema, phlebitis or evidence of cellulitis. Neurologic patient oriented x 3, cranial nerves intact, no focal neurologic deficits noted. Psychological mental status normal and normal affect.  Assessment and plan: Chronic GERD doing well on daily PPI therapy.  He does have some intermittent dysphagia in his mid substernal area suggesting possible stricture.  I've scheduled him for endoscopy his convenience and will continue current management.  His rectal bleeding sounds are normal in nature but he does need colonoscopy which also has been scheduled at his convenience to exclude colonic polyposis.  Review of his labs shows a stable hemoglobin with definite iron deficiency.

## 2014-02-05 ENCOUNTER — Ambulatory Visit (AMBULATORY_SURGERY_CENTER): Payer: PRIVATE HEALTH INSURANCE | Admitting: Gastroenterology

## 2014-02-05 ENCOUNTER — Encounter: Payer: Self-pay | Admitting: Gastroenterology

## 2014-02-05 ENCOUNTER — Telehealth: Payer: Self-pay | Admitting: *Deleted

## 2014-02-05 VITALS — BP 121/77 | HR 57 | Temp 96.4°F | Resp 16 | Ht 71.0 in | Wt 217.0 lb

## 2014-02-05 DIAGNOSIS — D131 Benign neoplasm of stomach: Secondary | ICD-10-CM

## 2014-02-05 DIAGNOSIS — K449 Diaphragmatic hernia without obstruction or gangrene: Secondary | ICD-10-CM

## 2014-02-05 DIAGNOSIS — K317 Polyp of stomach and duodenum: Secondary | ICD-10-CM

## 2014-02-05 DIAGNOSIS — K625 Hemorrhage of anus and rectum: Secondary | ICD-10-CM

## 2014-02-05 DIAGNOSIS — K649 Unspecified hemorrhoids: Secondary | ICD-10-CM

## 2014-02-05 DIAGNOSIS — R1319 Other dysphagia: Secondary | ICD-10-CM

## 2014-02-05 DIAGNOSIS — K219 Gastro-esophageal reflux disease without esophagitis: Secondary | ICD-10-CM

## 2014-02-05 DIAGNOSIS — Z1211 Encounter for screening for malignant neoplasm of colon: Secondary | ICD-10-CM

## 2014-02-05 DIAGNOSIS — D509 Iron deficiency anemia, unspecified: Secondary | ICD-10-CM

## 2014-02-05 DIAGNOSIS — K225 Diverticulum of esophagus, acquired: Secondary | ICD-10-CM

## 2014-02-05 MED ORDER — SODIUM CHLORIDE 0.9 % IV SOLN: 500.0000 mL | INTRAVENOUS | Status: DC

## 2014-02-05 NOTE — Telephone Encounter (Signed)
Patient scheduled for Hemorrhoid banding on 03/01/14. Sent information to Recovery nurse to give to patient. Pt notified per Recovery Nurse working with Dr. Sharlett Iles.

## 2014-02-05 NOTE — Telephone Encounter (Signed)
Per Dr Buel Ream note on COLON today, hemorrhoid banding please. Thanks.

## 2014-02-05 NOTE — Patient Instructions (Signed)
YOU HAD AN ENDOSCOPIC PROCEDURE TODAY AT THE [facility]: Refer to the procedure report that was given to you for any specific questions about what was found during the examination.  If the procedure report does not answer your questions, please call your gastroenterologist to clarify.  If you requested that your care partner not be given the details of your procedure findings, then the procedure report has been included in a sealed envelope for you to review at your convenience later.  YOU SHOULD EXPECT: Some feelings of bloating in the abdomen. Passage of more gas than usual.  Walking can help get rid of the air that was put into your GI tract during the procedure and reduce the bloating. If you had a lower endoscopy (such as a colonoscopy or flexible sigmoidoscopy) you may notice spotting of blood in your stool or on the toilet paper. If you underwent a bowel prep for your procedure, then you may not have a normal bowel movement for a few days.  DIET: Your first meal following the procedure should be a light meal and then it is ok to progress to your normal diet.  A half-sandwich or bowl of soup is an example of a good first meal.  Heavy or fried foods are harder to digest and may make you feel nauseous or bloated.  Likewise meals heavy in dairy and vegetables can cause extra gas to form and this can also increase the bloating.  Drink plenty of fluids but you should avoid alcoholic beverages for 24 hours.  ACTIVITY: Your care partner should take you home directly after the procedure.  You should plan to take it easy, moving slowly for the rest of the day.  You can resume normal activity the day after the procedure however you should NOT DRIVE or use heavy machinery for 24 hours (because of the sedation medicines used during the test).    SYMPTOMS TO REPORT IMMEDIATELY: A gastroenterologist can be reached at any hour.  During normal business hours, 8:30 AM to 5:00 PM Monday through Friday,  call [phone].  After hours and on weekends, please call the GI answering service at [phone] who will take a message and have the physician on call contact you.   Following lower endoscopy (colonoscopy or flexible sigmoidoscopy):  Excessive amounts of blood in the stool  Significant tenderness or worsening of abdominal pains  Swelling of the abdomen that is new, acute  Fever of 100F or higher  Following upper endoscopy (EGD)  Vomiting of blood or coffee ground material  New chest pain or pain under the shoulder blades  Painful or persistently difficult swallowing  New shortness of breath  Fever of 100F or higher  Black, tarry-looking stools  FOLLOW UP: If any biopsies were taken you will be contacted by phone or by letter within the next 1-3 weeks.  Call your gastroenterologist if you have not heard about the biopsies in 3 weeks.  Our staff will call the home number listed on your records the next business day following your procedure to check on you and address any questions or concerns that you may have at that time regarding the information given to you following your procedure. This is a courtesy call and so if there is no answer at the home number and we have not heard from you through the emergency physician on call, we will assume that you have returned to your regular daily activities without incident.  SIGNATURES/CONFIDENTIALITY: You and/or your care   partner have signed paperwork which will be entered into your electronic medical record.  These signatures attest to the fact that that the information above on your After Visit Summary has been reviewed and is understood.  Full responsibility of the confidentiality of this discharge information lies with you and/or your care-partner.  

## 2014-02-05 NOTE — Progress Notes (Signed)
Called to room to assist during endoscopic procedure.  Patient ID and intended procedure confirmed with present staff. Received instructions for my participation in the procedure from the performing physician.  

## 2014-02-05 NOTE — Op Note (Signed)
[place]  [address] [place], [address]   COLONOSCOPY PROCEDURE REPORT  PATIENT: Nathan Meadows, Nathan Meadows  MR#: [id number] BIRTHDATE: 1956-08-06 , 76  yrs. old GENDER: Male ENDOSCOPIST: Sable Feil, MD, [facility] REFERRED BY: PROCEDURE DATE:  02/05/2014 PROCEDURE:   Colonoscopy, screening First Screening Colonoscopy - Avg.  risk and is 50 yrs.  old or older - No.      History of Adenoma - Now for follow-up colonoscopy & has been > or = to 3 yrs.  Yes hx of adenoma.  Has been 3 or more years since last colonoscopy. ASA CLASS:   Class II INDICATIONS:Rectal Bleeding, Iron Deficiency Anemia, and Average risk patient for colon cancer. MEDICATIONS: propofol (Diprivan) 250mg  IV  DESCRIPTION OF PROCEDURE:   After the risks benefits and alternatives of the procedure were thoroughly explained, informed consent was obtained.  A digital rectal exam revealed no abnormalities of the rectum.   The LB [id number]  endoscope was introduced through the anus and advanced to the cecum, which was identified by both the appendix and ileocecal valve. No adverse events experienced.   The quality of the prep was excellent, using MoviPrep  The instrument was then slowly withdrawn as the colon was fully examined.      COLON FINDINGS: There was moderate diverticulosis noted in the descending colon and sigmoid colon with associated muscular hypertrophy and colonic spasm.   Internal hemorrhoids were found. The colon was otherwise normal.  There was no diverticulosis, inflammation, polyps or cancers unless previously stated. Retroflexed views revealed internal hemorrhoids. The time to cecum=1 minutes 30 seconds.  Withdrawal time=11 minutes 10 seconds. Close exam several times of the left colon shows no mucosal or polypoid lesions or bleeding or heme. The scope was withdrawn and the procedure completed. COMPLICATIONS: There were no complications.  ENDOSCOPIC IMPRESSION: 1.    There was moderate diverticulosis noted in the descending colon and sigmoid colon 2.   Internal hemorrhoids 3.   The colon was otherwise normal  RECOMMENDATIONS: 1.Continue current medications 2. hemorrhoid banding per Dr. Carlean Purl   eSigned:  Sable Feil, MD, [facility] 02/05/2014 2:35 PM   cc:   PATIENT NAME:  Nathan Meadows, Nathan Meadows MR#: [id number]

## 2014-02-05 NOTE — Op Note (Signed)
[place]  [address]. [place], [address]   ENDOSCOPY PROCEDURE REPORT  PATIENT: Nathan, Meadows  MR#: [id number] BIRTHDATE: October 05, 1956 , 55  yrs. old GENDER: Male ENDOSCOPIST:David Consuello Masse, MD, [facility] REFERRED BY: PROCEDURE DATE:  02/05/2014 PROCEDURE:   EGD w/ biopsy and EGD w/ biopsy for H.pylori ASA CLASS:    Class II INDICATIONS: Dysphagia, History of esophageal reflux, and Iron deficiency anemia. MEDICATION: There was residual sedation effect present from prior procedure and propofol (Diprivan) 150mg  IV TOPICAL ANESTHETIC:  DESCRIPTION OF PROCEDURE:   After the risks and benefits of the procedure were explained, informed consent was obtained.  The LB [id number]  endoscope was introduced through the mouth  and advanced to the second portion of the duodenum .  The instrument was slowly withdrawn as the mucosa was fully examined.      DUODENUM: The duodenal mucosa showed no abnormalities.  Cold forceps biopsies were taken in the bulb and second portion.  There were multiple polyps in the stomach measuring 5 mm to 1 cm in size.  There is some dried heme noted in the stomach but no active bleeding.  There were no atypical appearing gastric polyps.  They all appeared to be a hyperplastic polyps in the fundus and body of the stomach.  Multiple pictures were obtained for documentation of her multiple biopsies.  A biopsy was also was done of the antrum of the stomach for CLO testing.  Multiple pictures were obtained the documentation.  There are no ulcerations or other mucosal lesions noted.   There were multiple polyps in the stomach measuring 5 mm to 1 cm in size.  There is some dried heme noted in the stomach but no active bleeding.  There were no atypical appearing gastric polyps.  They all appeared to be a hyperplastic polyps in the fundus and body of the stomach.  Multiple pictures were obtained for documentation of her multiple biopsies.   A biopsy was also was done of the antrum of the stomach for CLO testing.  Multiple pictures were obtained the documentation.  There are no ulcerations or other mucosal lesions noted.  ESOPHAGUS: The mucosa of the esophagus appeared normal. Retroflexed views revealed no abnormalities.    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 It was difficult to intubate the patient because the large nature of this diverticulum.  Pictures were obtained the documentation. There were no other mucosal polypoid lesions seen in the esophagus.  COMPLICATIONS: There were no complications.   ENDOSCOPIC IMPRESSION: 1.   The duodenal mucosa showed no abnormalities 2.   There were multiple polyps in the stomach measuring 5 mm to 1 cm in size.  There is some dried heme noted in the stomach but no active bleeding.  There were no atypical appearing gastric polyps. They all appeared to be a hyperplastic polyps in the fundus and body of the stomach.  Multiple pictures were obtained for documentation of her multiple biopsies.  A biopsy was also was done of the antrum of the stomach for CLO testing.  Multiple pictures were obtained the documentation.  There are no ulcerations or other mucosal lesions noted. . 3.   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  Await BRAVO results 2.  Continue current medications 3. barium swallow...probably will need ENT referral to DR. Radene Journey    _______________________________ eSigned:  Sable Feil, MD, [facility]  02/05/2014 2:59 PM   standard discharge   PATIENT NAME:  Nathan, Meadows MR#: [id number]

## 2014-02-05 NOTE — Progress Notes (Signed)
Stable to RR 

## 2014-02-05 NOTE — Progress Notes (Signed)
Patient given banding instructions and scheduled appointment time. Verbalized understanding and agreement with plan.

## 2014-02-08 ENCOUNTER — Encounter: Payer: Self-pay | Admitting: Gastroenterology

## 2014-02-08 ENCOUNTER — Telehealth: Payer: Self-pay | Admitting: *Deleted

## 2014-02-08 LAB — HELICOBACTER PYLORI SCREEN-BIOPSY: UREASE: NEGATIVE

## 2014-02-08 NOTE — Telephone Encounter (Signed)
  Follow up Call-  Call back number 02/05/2014  Post procedure Call Back phone  # [phone]  Permission to leave phone message Yes     Patient questions:  Message left to call us if necessary.

## 2014-02-10 ENCOUNTER — Telehealth: Payer: Self-pay | Admitting: *Deleted

## 2014-02-10 DIAGNOSIS — K225 Diverticulum of esophagus, acquired: Secondary | ICD-10-CM

## 2014-02-10 DIAGNOSIS — R1310 Dysphagia, unspecified: Secondary | ICD-10-CM

## 2014-02-10 NOTE — Telephone Encounter (Signed)
Pt will have BS on 02/16/14 and see Dr Radene Journey on 02/19/14; pt stated understanding.

## 2014-02-12 ENCOUNTER — Encounter: Payer: Self-pay | Admitting: Gastroenterology

## 2014-02-15 ENCOUNTER — Telehealth: Payer: Self-pay | Admitting: Gastroenterology

## 2014-02-15 NOTE — Telephone Encounter (Signed)
Spoke with patient and gave him radiology number to call to reschedule procedure.

## 2014-02-16 ENCOUNTER — Ambulatory Visit (HOSPITAL_COMMUNITY): Payer: PRIVATE HEALTH INSURANCE

## 2014-02-17 ENCOUNTER — Ambulatory Visit (HOSPITAL_COMMUNITY)
Admission: RE | Admit: 2014-02-17 | Discharge: 2014-02-17 | Disposition: A | Discharge status: Home / Self Care | Payer: PRIVATE HEALTH INSURANCE | Source: Ambulatory Visit | Attending: Gastroenterology | Admitting: Gastroenterology

## 2014-02-17 DIAGNOSIS — K222 Esophageal obstruction: Secondary | ICD-10-CM | POA: Insufficient documentation

## 2014-02-17 DIAGNOSIS — K225 Diverticulum of esophagus, acquired: Secondary | ICD-10-CM | POA: Insufficient documentation

## 2014-02-17 DIAGNOSIS — R1310 Dysphagia, unspecified: Secondary | ICD-10-CM

## 2014-02-18 ENCOUNTER — Other Ambulatory Visit: Payer: Self-pay | Admitting: *Deleted

## 2014-02-18 DIAGNOSIS — K225 Diverticulum of esophagus, acquired: Secondary | ICD-10-CM

## 2014-02-22 ENCOUNTER — Telehealth: Payer: Self-pay | Admitting: Internal Medicine

## 2014-02-22 NOTE — Telephone Encounter (Signed)
Left message for patient to call back  

## 2014-02-22 NOTE — Telephone Encounter (Signed)
Patient is having a zenker's diverticulum surgery next week and wanted to know if the hemorrhoid banding will effect it.  He is assured that should not be a problem and can keep the appt for 03/01/14 if willing.

## 2014-02-24 ENCOUNTER — Encounter (HOSPITAL_COMMUNITY): Payer: Self-pay | Admitting: Pharmacy Technician

## 2014-02-25 NOTE — Pre-Procedure Instructions (Signed)
Nathan Meadows  02/25/2014   Your procedure is scheduled on: Wednesday, March 4.  Report to [facility], Main Entrance / Entrance "A" at 10:00AM.  Call this number if you have problems the morning of surgery: [phone]   Remember:   Do not eat food or drink liquids after midnight Tuesday.  Take these medicines the morning of surgery with A SIP OF WATER: lansoprazole (PREVACID).             Take if needed:ALPRAZolam [name]).     Do not wear jewelry, make-up or nail polish.  Do not wear lotions, powders, or perfumes. You may wear deodorant.   Men may shave face and neck.  Do not bring valuables to the hospital.  [facility] is not responsible for any belongings or valuables.               Contacts, dentures or bridgework may not be worn into surgery.  Leave suitcase in the car. After surgery it may be brought to your room.  For patients admitted to the hospital, discharge time is determined by your treatment team.               Patients discharged the day of surgery will not be allowed to drive home.  Name and phone number of your driver: -   Special Instructions: Review  [name] - Preparing For Surgery for shower instructions.   Please read over the following fact sheets that you were given: Pain Booklet, Coughing and Deep Breathing and Surgical Site Infection Prevention

## 2014-02-26 ENCOUNTER — Encounter (HOSPITAL_COMMUNITY)
Admission: RE | Admit: 2014-02-26 | Discharge: 2014-02-26 | Disposition: A | Discharge status: Home / Self Care | Payer: PRIVATE HEALTH INSURANCE | Source: Ambulatory Visit | Attending: Otolaryngology | Admitting: Otolaryngology

## 2014-02-26 ENCOUNTER — Encounter (HOSPITAL_COMMUNITY): Payer: Self-pay

## 2014-02-26 DIAGNOSIS — Z01812 Encounter for preprocedural laboratory examination: Secondary | ICD-10-CM | POA: Insufficient documentation

## 2014-02-26 DIAGNOSIS — Z01810 Encounter for preprocedural cardiovascular examination: Secondary | ICD-10-CM | POA: Insufficient documentation

## 2014-02-26 HISTORY — DX: Type 2 diabetes mellitus without complications: E11.9

## 2014-02-26 LAB — CBC
HCT: 45.2 % (ref 39.0–52.0)
HEMOGLOBIN: 15.6 g/dL (ref 13.0–17.0)
MCH: 30.8 pg (ref 26.0–34.0)
MCHC: 34.5 g/dL (ref 30.0–36.0)
MCV: 89.2 fL (ref 78.0–100.0)
PLATELETS: 177 10*3/uL (ref 150–400)
RBC: 5.07 MIL/uL (ref 4.22–5.81)
RDW: 14.4 % (ref 11.5–15.5)
WBC: 9.2 10*3/uL (ref 4.0–10.5)

## 2014-02-26 LAB — BASIC METABOLIC PANEL
BUN: 11 mg/dL (ref 6–23)
CHLORIDE: 104 meq/L (ref 96–112)
CO2: 28 mEq/L (ref 19–32)
CREATININE: 1.08 mg/dL (ref 0.50–1.35)
Calcium: 9.3 mg/dL (ref 8.4–10.5)
GFR, EST AFRICAN AMERICAN: 86 mL/min — AB (ref >90)
GFR, EST NON AFRICAN AMERICAN: 74 mL/min — AB (ref >90)
Glucose, Bld: 139 mg/dL — ABNORMAL HIGH (ref 70–99)
Potassium: 4.3 mEq/L (ref 3.7–5.3)
Sodium: 142 mEq/L (ref 137–147)

## 2014-02-26 NOTE — Pre-Procedure Instructions (Signed)
Nathan Meadows  02/26/2014   Your procedure is scheduled on:  03/03/14  Report to [facility]  2 * 3 at 10 AM.  Call this number if you have problems the morning of surgery: [phone]   Remember:   Do not eat food or drink liquids after midnight.   Take these medicines the morning of surgery with A SIP OF WATER: xanax,prevacid   Do not wear jewelry, make-up or nail polish.  Do not wear lotions, powders, or perfumes. You may wear deodorant.  Do not shave 48 hours prior to surgery. Men may shave face and neck.  Do not bring valuables to the hospital.  [facility] is not responsible                  for any belongings or valuables.               Contacts, dentures or bridgework may not be worn into surgery.  Leave suitcase in the car. After surgery it may be brought to your room.  For patients admitted to the hospital, discharge time is determined by your                treatment team.               Patients discharged the day of surgery will not be allowed to drive  home.  Name and phone number of your driver:   Special Instructions: Incentive Spirometry - Practice and bring it with you on the day of surgery. Shower using CHG 2 nights before surgery and the night before surgery.  If you shower the day of surgery use CHG.  Use special wash - you have one bottle of CHG for all showers.  You should use approximately 1/3 of the bottle for each shower.   Please read over the following fact sheets that you were given: Pain Booklet, Coughing and Deep Breathing, MRSA Information and Surgical Site Infection Prevention

## 2014-02-28 HISTORY — PX: HEMORRHOID BANDING: SHX5850

## 2014-03-01 ENCOUNTER — Ambulatory Visit: Payer: PRIVATE HEALTH INSURANCE | Admitting: Internal Medicine

## 2014-03-01 ENCOUNTER — Encounter: Payer: Self-pay | Admitting: Internal Medicine

## 2014-03-01 VITALS — BP 118/62 | HR 68 | Ht 71.0 in | Wt 219.0 lb

## 2014-03-01 DIAGNOSIS — K648 Other hemorrhoids: Secondary | ICD-10-CM | POA: Insufficient documentation

## 2014-03-01 NOTE — Assessment & Plan Note (Signed)
Grade 3 LL banded - repeat banding later in March after Zenker's repair

## 2014-03-01 NOTE — Progress Notes (Signed)
Patient ID: Nathan Meadows, male   DOB: 1956-03-11, 58 y.o.   MRN: [id number]        The patient describes chronic and intermittent rectal bleeding - with prolapse on the left at times - seems to spontaneously reduce. He denies prolonged straining to stool or any difficulty with defecation. He has had chronic coughing with his Zenker's diverticulum - he has noticed some bleeding increase when he golfs als "hard swing".  Rectal exam today shows a Grade 3 inflamed hemorrhoid protruding on left lateral aspect and two fleshy tags on right anterior and posterior. No mass. Colonoscopy early Feb 2015 showed internal hemorrhoids all 3 columns.  PROCEDURE NOTE: The patient presents with symptomatic grade 3  hemorrhoids, requesting rubber band ligation of his/her hemorrhoidal disease.  All risks, benefits and alternative forms of therapy were described and informed consent was obtained.   The decision was made to band the LL internal hemorrhoid, and the [place] was used to perform band ligation without complication.  Digital anorectal examination was then performed to assure proper positioning of the band, and to adjust the banded tissue as required.  The patient was discharged home without pain or other issues.  Dietary and behavioral recommendations were given and along with follow-up instructions.     The patient will return in about 4 weeks (after Zenker's surgery) for  follow-up and possible additional banding as required. No complications were encountered and the patient tolerated the procedure well.   I appreciate the opportunity to care for this patient.   VQ:QVZDG,LOVFI J, MD

## 2014-03-01 NOTE — Patient Instructions (Signed)
HEMORRHOID BANDING PROCEDURE    FOLLOW-UP CARE   1. The procedure you have had should have been relatively painless since the banding of the area involved does not have nerve endings and there is no pain sensation.  The rubber band cuts off the blood supply to the hemorrhoid and the band may fall off as soon as 48 hours after the banding (the band may occasionally be seen in the toilet bowl following a bowel movement). You may notice a temporary feeling of fullness in the rectum which should respond adequately to plain Tylenol or Motrin.  2. Following the banding, avoid strenuous exercise that evening and resume full activity the next day.  A sitz bath (soaking in a warm tub) or bidet is soothing, and can be useful for cleansing the area after bowel movements.     3. To avoid constipation, take two tablespoons of natural wheat bran, natural oat bran, flax, Benefiber or any over the counter fiber supplement and increase your water intake to 7-8 glasses daily.    4. Unless you have been prescribed anorectal medication, do not put anything inside your rectum for two weeks: No suppositories, enemas, fingers, etc.  5. Occasionally, you may have more bleeding than usual after the banding procedure.  This is often from the untreated hemorrhoids rather than the treated one.  Don't be concerned if there is a tablespoon or so of blood.  If there is more blood than this, lie flat with your bottom higher than your head and apply an ice pack to the area. If the bleeding does not stop within a half an hour or if you feel faint, call our office at [phone] or go to the emergency room.  6. Problems are not common; however, if there is a substantial amount of bleeding, severe pain, chills, fever or difficulty passing urine (very rare) or other problems, you should call us at [phone] or report to the nearest emergency room.  7. Do not stay seated continuously for more than 2-3 hours for a day or two  after the procedure.  Tighten your buttock muscles 10-15 times every two hours and take 10-15 deep breaths every 1-2 hours.  Do not spend more than a few minutes on the toilet if you cannot empty your bowel; instead re-visit the toilet at a later time.    We will see you at your next appointment for additional banding.   I appreciate the opportunity to care for you.

## 2014-03-03 ENCOUNTER — Inpatient Hospital Stay (HOSPITAL_COMMUNITY)
Admission: RE | Admit: 2014-03-03 | Discharge: 2014-03-06 | DRG: 328 | Disposition: A | Discharge status: Home / Self Care | Payer: PRIVATE HEALTH INSURANCE | Source: Ambulatory Visit | Attending: Otolaryngology | Admitting: Otolaryngology

## 2014-03-03 ENCOUNTER — Encounter (HOSPITAL_COMMUNITY)
Admission: RE | Disposition: A | Discharge status: Home / Self Care | Payer: Self-pay | Source: Ambulatory Visit | Attending: Otolaryngology

## 2014-03-03 ENCOUNTER — Inpatient Hospital Stay (HOSPITAL_COMMUNITY): Payer: PRIVATE HEALTH INSURANCE | Admitting: Anesthesiology

## 2014-03-03 ENCOUNTER — Encounter (HOSPITAL_COMMUNITY): Payer: PRIVATE HEALTH INSURANCE | Admitting: Anesthesiology

## 2014-03-03 ENCOUNTER — Encounter (HOSPITAL_COMMUNITY): Payer: Self-pay | Admitting: Anesthesiology

## 2014-03-03 DIAGNOSIS — E119 Type 2 diabetes mellitus without complications: Secondary | ICD-10-CM | POA: Diagnosis present

## 2014-03-03 DIAGNOSIS — M129 Arthropathy, unspecified: Secondary | ICD-10-CM | POA: Diagnosis present

## 2014-03-03 DIAGNOSIS — K449 Diaphragmatic hernia without obstruction or gangrene: Secondary | ICD-10-CM | POA: Diagnosis present

## 2014-03-03 DIAGNOSIS — Z9889 Other specified postprocedural states: Secondary | ICD-10-CM

## 2014-03-03 DIAGNOSIS — K219 Gastro-esophageal reflux disease without esophagitis: Secondary | ICD-10-CM | POA: Diagnosis present

## 2014-03-03 DIAGNOSIS — E785 Hyperlipidemia, unspecified: Secondary | ICD-10-CM | POA: Diagnosis present

## 2014-03-03 DIAGNOSIS — F411 Generalized anxiety disorder: Secondary | ICD-10-CM | POA: Diagnosis present

## 2014-03-03 DIAGNOSIS — Z8719 Personal history of other diseases of the digestive system: Secondary | ICD-10-CM

## 2014-03-03 DIAGNOSIS — K225 Diverticulum of esophagus, acquired: Principal | ICD-10-CM | POA: Diagnosis present

## 2014-03-03 HISTORY — PX: ESOPHAGOSCOPY: SHX5534

## 2014-03-03 HISTORY — PX: ESOPHAGEAL DILATION: SHX303

## 2014-03-03 HISTORY — PX: ZENKER'S DIVERTICULECTOMY: SHX5223

## 2014-03-03 LAB — GLUCOSE, CAPILLARY
GLUCOSE-CAPILLARY: 96 mg/dL (ref 70–99)
GLUCOSE-CAPILLARY: 87 mg/dL (ref 70–99)
Glucose-Capillary: 93 mg/dL (ref 70–99)

## 2014-03-03 SURGERY — ZENKER'S DIVERTICULECTOMY
Anesthesia: General | Site: Esophagus

## 2014-03-03 MED ORDER — ROCURONIUM BROMIDE 50 MG/5ML IV SOLN
INTRAVENOUS | Status: AC
  Filled 2014-03-03: qty 1

## 2014-03-03 MED ORDER — HYDROMORPHONE HCL PF 1 MG/ML IJ SOLN
INTRAMUSCULAR | Status: AC
  Administered 2014-03-03: 0.5 mg via INTRAVENOUS
  Filled 2014-03-03: qty 1

## 2014-03-03 MED ORDER — ARTIFICIAL TEARS OP OINT
TOPICAL_OINTMENT | OPHTHALMIC | Status: DC | PRN
  Administered 2014-03-03: 1 via OPHTHALMIC

## 2014-03-03 MED ORDER — FENTANYL CITRATE 0.05 MG/ML IJ SOLN
INTRAMUSCULAR | Status: DC | PRN
  Administered 2014-03-03 (×2): 50 ug via INTRAVENOUS
  Administered 2014-03-03: 150 ug via INTRAVENOUS

## 2014-03-03 MED ORDER — ONDANSETRON HCL 4 MG/2ML IJ SOLN
INTRAMUSCULAR | Status: DC | PRN
  Administered 2014-03-03: 4 mg via INTRAVENOUS

## 2014-03-03 MED ORDER — LACTATED RINGERS IV SOLN
INTRAVENOUS | Status: DC
  Administered 2014-03-03 (×2): via INTRAVENOUS

## 2014-03-03 MED ORDER — PROPOFOL 10 MG/ML IV BOLUS
INTRAVENOUS | Status: AC
  Filled 2014-03-03: qty 20

## 2014-03-03 MED ORDER — GLYCOPYRROLATE 0.2 MG/ML IJ SOLN
INTRAMUSCULAR | Status: DC | PRN
  Administered 2014-03-03: .8 mg via INTRAVENOUS

## 2014-03-03 MED ORDER — DEXAMETHASONE SODIUM PHOSPHATE 10 MG/ML IJ SOLN
INTRAMUSCULAR | Status: DC | PRN
  Administered 2014-03-03: 10 mg via INTRAVENOUS

## 2014-03-03 MED ORDER — CEFAZOLIN SODIUM 1-5 GM-% IV SOLN
1.0000 g | Freq: Three times a day (TID) | INTRAVENOUS | Status: DC
  Administered 2014-03-03 – 2014-03-06 (×8): 1 g via INTRAVENOUS
  Filled 2014-03-03 (×10): qty 50

## 2014-03-03 MED ORDER — ONDANSETRON HCL 4 MG/2ML IJ SOLN
INTRAMUSCULAR | Status: AC
  Filled 2014-03-03: qty 2

## 2014-03-03 MED ORDER — MIDAZOLAM HCL 2 MG/2ML IJ SOLN
INTRAMUSCULAR | Status: AC
  Filled 2014-03-03: qty 2

## 2014-03-03 MED ORDER — 0.9 % SODIUM CHLORIDE (POUR BTL) OPTIME
TOPICAL | Status: DC | PRN
  Administered 2014-03-03: 1000 mL

## 2014-03-03 MED ORDER — ONDANSETRON HCL 4 MG/2ML IJ SOLN
4.0000 mg | Freq: Three times a day (TID) | INTRAMUSCULAR | Status: DC
  Administered 2014-03-03 – 2014-03-06 (×9): 4 mg via INTRAVENOUS
  Filled 2014-03-03 (×6): qty 2

## 2014-03-03 MED ORDER — VECURONIUM BROMIDE 10 MG IV SOLR
INTRAVENOUS | Status: DC | PRN
  Administered 2014-03-03: 2 mg via INTRAVENOUS

## 2014-03-03 MED ORDER — MORPHINE SULFATE 2 MG/ML IJ SOLN
2.0000 mg | INTRAMUSCULAR | Status: DC | PRN
  Administered 2014-03-03 – 2014-03-04 (×7): 2 mg via INTRAVENOUS
  Filled 2014-03-03 (×8): qty 1

## 2014-03-03 MED ORDER — LIDOCAINE-EPINEPHRINE 1 %-1:100000 IJ SOLN
INTRAMUSCULAR | Status: DC | PRN
  Administered 2014-03-03: 6 mL

## 2014-03-03 MED ORDER — LIDOCAINE HCL (CARDIAC) 20 MG/ML IV SOLN
INTRAVENOUS | Status: AC
  Filled 2014-03-03: qty 5

## 2014-03-03 MED ORDER — PROPOFOL 10 MG/ML IV BOLUS
INTRAVENOUS | Status: DC | PRN
  Administered 2014-03-03: 200 mg via INTRAVENOUS

## 2014-03-03 MED ORDER — STERILE WATER FOR INJECTION IJ SOLN
INTRAMUSCULAR | Status: AC
  Filled 2014-03-03: qty 10

## 2014-03-03 MED ORDER — DEXAMETHASONE SODIUM PHOSPHATE 10 MG/ML IJ SOLN
INTRAMUSCULAR | Status: AC
  Filled 2014-03-03: qty 1

## 2014-03-03 MED ORDER — GLYCOPYRROLATE 0.2 MG/ML IJ SOLN
INTRAMUSCULAR | Status: AC
  Filled 2014-03-03: qty 5

## 2014-03-03 MED ORDER — PHENYLEPHRINE HCL 10 MG/ML IJ SOLN
INTRAMUSCULAR | Status: DC | PRN
  Administered 2014-03-03: 80 ug via INTRAVENOUS

## 2014-03-03 MED ORDER — NEOSTIGMINE METHYLSULFATE 1 MG/ML IJ SOLN
INTRAMUSCULAR | Status: AC
  Filled 2014-03-03: qty 10

## 2014-03-03 MED ORDER — LIDOCAINE HCL (CARDIAC) 20 MG/ML IV SOLN
INTRAVENOUS | Status: DC | PRN
  Administered 2014-03-03: 80 mg via INTRAVENOUS

## 2014-03-03 MED ORDER — BACITRACIN ZINC 500 UNIT/GM EX OINT
TOPICAL_OINTMENT | CUTANEOUS | Status: DC | PRN
  Administered 2014-03-03: 1 via TOPICAL

## 2014-03-03 MED ORDER — CEFAZOLIN SODIUM-DEXTROSE 2-3 GM-% IV SOLR
INTRAVENOUS | Status: AC
  Administered 2014-03-03: 2 g via INTRAVENOUS
  Filled 2014-03-03: qty 50

## 2014-03-03 MED ORDER — EPHEDRINE SULFATE 50 MG/ML IJ SOLN
INTRAMUSCULAR | Status: DC | PRN
  Administered 2014-03-03: 10 mg via INTRAVENOUS
  Administered 2014-03-03: 5 mg via INTRAVENOUS

## 2014-03-03 MED ORDER — BACITRACIN ZINC 500 UNIT/GM EX OINT
TOPICAL_OINTMENT | CUTANEOUS | Status: AC
  Filled 2014-03-03: qty 15

## 2014-03-03 MED ORDER — HYDROCODONE-ACETAMINOPHEN 7.5-325 MG/15ML PO SOLN: 10.0000 mL | ORAL | Status: DC | PRN

## 2014-03-03 MED ORDER — HYDROMORPHONE HCL PF 1 MG/ML IJ SOLN
0.2500 mg | INTRAMUSCULAR | Status: DC | PRN
  Administered 2014-03-03 (×4): 0.5 mg via INTRAVENOUS

## 2014-03-03 MED ORDER — MIDAZOLAM HCL 5 MG/5ML IJ SOLN
INTRAMUSCULAR | Status: DC | PRN
  Administered 2014-03-03: 2 mg via INTRAVENOUS

## 2014-03-03 MED ORDER — KCL IN DEXTROSE-NACL 20-5-0.45 MEQ/L-%-% IV SOLN
INTRAVENOUS | Status: DC
  Administered 2014-03-03 – 2014-03-05 (×6): via INTRAVENOUS
  Filled 2014-03-03 (×11): qty 1000

## 2014-03-03 MED ORDER — LIDOCAINE-EPINEPHRINE 1 %-1:100000 IJ SOLN
INTRAMUSCULAR | Status: AC
  Filled 2014-03-03: qty 1

## 2014-03-03 MED ORDER — SUCCINYLCHOLINE CHLORIDE 20 MG/ML IJ SOLN
INTRAMUSCULAR | Status: DC | PRN
  Administered 2014-03-03: 100 mg via INTRAVENOUS

## 2014-03-03 MED ORDER — FENTANYL CITRATE 0.05 MG/ML IJ SOLN
INTRAMUSCULAR | Status: AC
  Filled 2014-03-03: qty 5

## 2014-03-03 MED ORDER — ROCURONIUM BROMIDE 100 MG/10ML IV SOLN
INTRAVENOUS | Status: DC | PRN
  Administered 2014-03-03: 35 mg via INTRAVENOUS

## 2014-03-03 MED ORDER — BACITRACIN ZINC 500 UNIT/GM EX OINT
1.0000 "application " | TOPICAL_OINTMENT | Freq: Three times a day (TID) | CUTANEOUS | Status: DC
  Administered 2014-03-03: 1 via TOPICAL
  Administered 2014-03-03: 19:00:00 via TOPICAL
  Administered 2014-03-04 – 2014-03-06 (×7): 1 via TOPICAL
  Filled 2014-03-03: qty 28.35
  Filled 2014-03-03: qty 15

## 2014-03-03 MED ORDER — NEOSTIGMINE METHYLSULFATE 1 MG/ML IJ SOLN
INTRAMUSCULAR | Status: DC | PRN
  Administered 2014-03-03: 4 mg via INTRAVENOUS

## 2014-03-03 MED ORDER — VECURONIUM BROMIDE 10 MG IV SOLR
INTRAVENOUS | Status: AC
  Filled 2014-03-03: qty 10

## 2014-03-03 SURGICAL SUPPLY — 45 items
0035280 ×1 IMPLANT
CATH ROBINSON RED A/P 18FR (CATHETERS) IMPLANT
CLEANER TIP ELECTROSURG 2X2 (MISCELLANEOUS) ×1 IMPLANT
CONT SPEC 4OZ CLIKSEAL STRL BL (MISCELLANEOUS) ×1 IMPLANT
COVER MAYO STAND STRL (DRAPES) ×1 IMPLANT
COVER SURGICAL LIGHT HANDLE (MISCELLANEOUS) ×2 IMPLANT
COVER TABLE BACK 60X90 (DRAPES) ×3 IMPLANT
CUTTER LINEAR ENDO 35 ETS (STAPLE) ×2 IMPLANT
DRAIN JACKSON PRT FLT 10 (DRAIN) ×1 IMPLANT
DRAPE ORTHO SPLIT 77X108 STRL (DRAPES) ×2
DRAPE PROXIMA HALF (DRAPES) ×2 IMPLANT
DRAPE SURG ORHT 6 SPLT 77X108 (DRAPES) IMPLANT
GLOVE BIOGEL PI IND STRL 6.5 (GLOVE) IMPLANT
GLOVE BIOGEL PI IND STRL 7.0 (GLOVE) IMPLANT
GLOVE BIOGEL PI INDICATOR 6.5 (GLOVE) ×1
GLOVE BIOGEL PI INDICATOR 7.0 (GLOVE) ×1
GLOVE SS BIOGEL STRL SZ 7.5 (GLOVE) ×1 IMPLANT
GLOVE SUPERSENSE BIOGEL SZ 7.5 (GLOVE) ×4
GLOVE SURG SS PI 6.5 STRL IVOR (GLOVE) ×1 IMPLANT
GLOVE SURG SS PI 7.0 STRL IVOR (GLOVE) ×1 IMPLANT
GLOVE SURG SS PI 7.5 STRL IVOR (GLOVE) ×3 IMPLANT
GUARD TEETH (MISCELLANEOUS) ×2 IMPLANT
KIT ROOM TURNOVER OR (KITS) ×2 IMPLANT
NDL HYPO 25GX1X1/2 BEV (NEEDLE) IMPLANT
NEEDLE HYPO 25GX1X1/2 BEV (NEEDLE) ×2 IMPLANT
NS IRRIG 1000ML POUR BTL (IV SOLUTION) ×2 IMPLANT
PAD ARMBOARD 7.5X6 YLW CONV (MISCELLANEOUS) ×4 IMPLANT
PENCIL BUTTON HOLSTER BLD 10FT (ELECTRODE) ×1 IMPLANT
RELOAD CUTTER ETS 35MM STAND (ENDOMECHANICALS) IMPLANT
SOLUTION ANTI FOG 6CC (MISCELLANEOUS) ×3 IMPLANT
SPONGE GAUZE 4X4 12PLY (GAUZE/BANDAGES/DRESSINGS) ×2 IMPLANT
SPONGE INTESTINAL PEANUT (DISPOSABLE) ×2 IMPLANT
SPONGE LAP 18X18 X RAY DECT (DISPOSABLE) ×1 IMPLANT
SPONGE LAP 4X18 X RAY DECT (DISPOSABLE) ×1 IMPLANT
SURGILUBE 2OZ TUBE FLIPTOP (MISCELLANEOUS) ×2 IMPLANT
SUT CHROMIC 3 0 PS 2 (SUTURE) ×2 IMPLANT
SUT CHROMIC 3 0 SH 27 (SUTURE) ×1 IMPLANT
SUT CHROMIC GUT 2 0 PS 2 27 (SUTURE) ×1 IMPLANT
SUT ETHILON 3 0 PS 1 (SUTURE) ×1 IMPLANT
SUT ETHILON 5 0 PS 2 18 (SUTURE) ×1 IMPLANT
SYR CONTROL 10ML LL (SYRINGE) ×1 IMPLANT
TAPE CLOTH SOFT 2X10 (GAUZE/BANDAGES/DRESSINGS) ×1 IMPLANT
TOWEL OR 17X24 6PK STRL BLUE (TOWEL DISPOSABLE) ×2 IMPLANT
TOWEL OR 17X26 10 PK STRL BLUE (TOWEL DISPOSABLE) ×1 IMPLANT
TUBE CONNECTING 12X1/4 (SUCTIONS) ×3 IMPLANT

## 2014-03-03 NOTE — H&P (Signed)
PREOPERATIVE H&P  Chief Complaint: dysphagia  HPI: Nathan Meadows is a 58 y.o. male who presents for evaluation of dysphagia and regurgitation.  He had a Barium Swallow which showed a large Zenker's Diverticulum.  He is taken to the OR for treatment of the Zenker's Diverticulum.    Past Medical History  Diagnosis Date  . Diverticulosis of colon (without mention of hemorrhage)   . GERD (gastroesophageal reflux disease)   . Hiatal hernia   . Iron deficiency anemia   . Anxiety   . Arthritis   . Pre-diabetes   . Shingles   . HLD (hyperlipidemia)   . Diabetes mellitus without complication     pre    no meds   Past Surgical History  Procedure Laterality Date  . Colonoscopy  multiple    Diverticulosis  . Esophagogastroduodenoscopy  multiple    HH and GERD   . Medial collateral ligament and lateral collateral ligament repair, knee Right    History   Social History  . Marital Status: Widowed    Spouse Name: N/A    Number of Children: 1  . Years of Education: N/A   Occupational History  . [employer]     retired   Social History Main Topics  . Smoking status: Never Smoker   . Smokeless tobacco: Never Used  . Alcohol Use: Yes     Comment: 2 per day  . Drug Use: No  . Sexual Activity: None   Other Topics Concern  . None   Social History Narrative  . None   Family History  Problem Relation Age of Onset  . Diabetes Brother   . Heart attack Father   . Heart attack Brother    No Known Allergies Prior to Admission medications   Medication Sig Start Date End Date Taking? Authorizing Provider  ALPRAZolam [name]) 1 MG tablet Take 0.5 mg by mouth 2 (two) times daily as needed for anxiety.  02/26/12  Yes Historical Provider, MD  lansoprazole (PREVACID) 30 MG capsule Take 30 mg by mouth Daily. 03/10/12  Yes Historical Provider, MD  Multiple Vitamin (MULTIVITAMIN) tablet Take 1 tablet by mouth daily.   Yes Historical Provider, MD  ONGLYZA 2.5 MG TABS tablet Take 2.5 mg by mouth  Daily. 02/15/12  Yes Historical Provider, MD  rosuvastatin (CRESTOR) 5 MG tablet Take 5 mg by mouth once a week.   Yes Historical Provider, MD  ZETIA 10 MG tablet Take 10 mg by mouth Daily. 02/12/12  Yes Historical Provider, MD     Positive ROS: per HPI  All other systems have been reviewed and were otherwise negative with the exception of those mentioned in the HPI and as above.  Physical Exam: Filed Vitals:   03/03/14 0949  BP: 120/78  Pulse: 63  Temp: 97.9 F (36.6 C)  Resp: 20    General: Alert, no acute distress Oral: Normal oral mucosa and tonsils Nasal: Clear nasal passages Neck: No palpable adenopathy or thyroid nodules Ear: Ear canal is clear with normal appearing TMs Cardiovascular: Regular rate and rhythm, no murmur.  Respiratory: Clear to auscultation Neurologic: Alert and oriented x 3   Assessment/Plan: Zenker's Diverticulum Plan for Procedure(s): [place]   Melony Overly, MD 03/03/2014 11:41 AM

## 2014-03-03 NOTE — Anesthesia Procedure Notes (Signed)
Procedure Name: Intubation Date/Time: 03/03/2014 12:24 PM Performed by: Neldon Newport Pre-anesthesia Checklist: Patient identified, Timeout performed, Emergency Drugs available, Patient being monitored and Suction available Patient Re-evaluated:Patient Re-evaluated prior to inductionOxygen Delivery Method: Circle system utilized Preoxygenation: Pre-oxygenation with 100% oxygen Intubation Type: IV induction Ventilation: Mask ventilation without difficulty Tube type: Oral Tube size: 7.0 mm Number of attempts: 1 Airway Equipment and Method: Video-laryngoscopy Placement Confirmation: ETT inserted through vocal cords under direct vision,  positive ETCO2 and breath sounds checked- equal and bilateral (oral structures unchanged after intubation) Secured at: 23 cm Tube secured with: Tape Dental Injury: Teeth and Oropharynx as per pre-operative assessment  Difficulty Due To: Difficulty was anticipated Future Recommendations: Recommend- induction with short-acting agent, and alternative techniques readily available

## 2014-03-03 NOTE — Preoperative (Signed)
Beta Blockers   Reason not to administer Beta Blockers:Not Applicable 

## 2014-03-03 NOTE — Anesthesia Preprocedure Evaluation (Addendum)
Anesthesia Evaluation  Patient identified by MRN, date of birth, ID band Patient awake    Reviewed: Allergy & Precautions, H&P , NPO status , Patient's Chart, lab work & pertinent test results  Airway Mallampati: III      Dental  (+) Teeth Intact, Dental Advidsory Given   Pulmonary neg pulmonary ROS,  breath sounds clear to auscultation        Cardiovascular negative cardio ROS  Rhythm:Regular Rate:Normal     Neuro/Psych  Neuromuscular disease    GI/Hepatic Neg liver ROS, hiatal hernia, GERD-  ,  Endo/Other  diabetes  Renal/GU negative Renal ROS     Musculoskeletal   Abdominal   Peds  Hematology   Anesthesia Other Findings Small mandible, overbite and poor dentition of lower teeth  Reproductive/Obstetrics                        Anesthesia Physical Anesthesia Plan  ASA: III  Anesthesia Plan: General   Post-op Pain Management:    Induction: Intravenous  Airway Management Planned: Oral ETT  Additional Equipment:   Intra-op Plan:   Post-operative Plan: Possible Post-op intubation/ventilation  Informed Consent: I have reviewed the patients History and Physical, chart, labs and discussed the procedure including the risks, benefits and alternatives for the proposed anesthesia with the patient or authorized representative who has indicated his/her understanding and acceptance.   Dental advisory given and Dental Advisory Given  Plan Discussed with: CRNA, Anesthesiologist and Surgeon  Anesthesia Plan Comments:        Anesthesia Quick Evaluation

## 2014-03-03 NOTE — Brief Op Note (Signed)
03/03/2014  3:53 PM  PATIENT:  Nathan Meadows  58 y.o. male  PRE-OPERATIVE DIAGNOSIS:  Zenker's Diverticulum  POST-OPERATIVE DIAGNOSIS:  Zenker's Diverticulum  PROCEDURE:  Procedure(s): EXCISION OF ZENKER'S DIVERTICULUM (N/A) ESOPHAGOSCOPY (N/A) ESOPHAGEAL DILATION (N/A)  SURGEON:  Surgeon(s) and Role:    * Thornell Sartorius, MD - Assisting    * Rozetta Nunnery, MD - Primary  PHYSICIAN ASSISTANT:   ASSISTANTS: Minna Merritts MD   ANESTHESIA:   general  EBL:  Total I/O In: 1800 [I.V.:1800] Out: 57 [Blood:50]  BLOOD ADMINISTERED:none  DRAINS: Penrose drain in the JP drain left neck   LOCAL MEDICATIONS USED:  LIDOCAINE with EPI  5 cc  SPECIMEN:  Source of Specimen:  left zenker's diverticulum  DISPOSITION OF SPECIMEN:  PATHOLOGY  COUNTS:  YES  TOURNIQUET:  * No tourniquets in log *  DICTATION: .Other Dictation: Dictation Number [id number]  PLAN OF CARE: Admit to inpatient   PATIENT DISPOSITION:  PACU - hemodynamically stable.   Delay start of Pharmacological VTE agent (>24hrs) due to surgical blood loss or risk of bleeding: yes

## 2014-03-03 NOTE — Anesthesia Postprocedure Evaluation (Signed)
Anesthesia Post Note  Patient: Nathan Meadows  Procedure(s) Performed: Procedure(s) (LRB): EXCISION OF ZENKER'S DIVERTICULUM (N/A) ESOPHAGOSCOPY (N/A) ESOPHAGEAL DILATION (N/A)  Anesthesia type: General  Patient location: PACU  Post pain: Pain level controlled  Post assessment: Patient's Cardiovascular Status Stable  Last Vitals:  Filed Vitals:   03/03/14 1630  BP: 154/82  Pulse: 84  Temp:   Resp: 14    Post vital signs: Reviewed and stable  Level of consciousness: alert  Complications: No apparent anesthesia complications

## 2014-03-03 NOTE — Transfer of Care (Signed)
Immediate Anesthesia Transfer of Care Note  Patient: Nathan Meadows  Procedure(s) Performed: Procedure(s): EXCISION OF ZENKER'S DIVERTICULUM (N/A) ESOPHAGOSCOPY (N/A) ESOPHAGEAL DILATION (N/A)  Patient Location: PACU  Anesthesia Type:General  Level of Consciousness: awake, alert  and oriented  Airway & Oxygen Therapy: Patient Spontanous Breathing and Patient connected to face mask oxygen  Post-op Assessment: Report given to PACU RN, Post -op Vital signs reviewed and stable and Patient moving all extremities X 4  Post vital signs: Reviewed and stable  Complications: No apparent anesthesia complications

## 2014-03-04 ENCOUNTER — Encounter (HOSPITAL_COMMUNITY): Payer: Self-pay | Admitting: Otolaryngology

## 2014-03-04 LAB — GLUCOSE, CAPILLARY
Glucose-Capillary: 124 mg/dL — ABNORMAL HIGH (ref 70–99)
Glucose-Capillary: 124 mg/dL — ABNORMAL HIGH (ref 70–99)
Glucose-Capillary: 140 mg/dL — ABNORMAL HIGH (ref 70–99)
Glucose-Capillary: 141 mg/dL — ABNORMAL HIGH (ref 70–99)

## 2014-03-04 MED ORDER — PRO-STAT SUGAR FREE PO LIQD
30.0000 mL | Freq: Every day | ORAL | Status: DC
  Administered 2014-03-04: 30 mL
  Filled 2014-03-04 (×2): qty 30

## 2014-03-04 MED ORDER — ALPRAZOLAM 0.5 MG PO TABS
0.5000 mg | ORAL_TABLET | Freq: Two times a day (BID) | ORAL | Status: DC | PRN
  Administered 2014-03-04 (×2): 0.5 mg via NASOGASTRIC
  Filled 2014-03-04 (×2): qty 1

## 2014-03-04 MED ORDER — JEVITY 1.5 CAL/FIBER PO LIQD
1000.0000 mL | ORAL | Status: DC
  Administered 2014-03-04: 1000 mL
  Filled 2014-03-04 (×3): qty 1000

## 2014-03-04 MED ORDER — JEVITY 1.2 CAL PO LIQD
1000.0000 mL | ORAL | Status: DC
  Administered 2014-03-04: 1000 mL
  Filled 2014-03-04 (×2): qty 1000

## 2014-03-04 NOTE — Progress Notes (Signed)
POD 1 AF VSS JP output 30cc Neck wound without swelling Stable post op course. Plan to increase activity and begin NG tube feeding Will obtain esophagram tomorrow to eval repair and r/o leak prior to starting po diet

## 2014-03-04 NOTE — Op Note (Signed)
NAMEWILLE, AUBUCHON                ACCOUNT NO.:  [id number]  MEDICAL RECORD NO.:  [id number]  LOCATION:  6N11C                        FACILITY:  [facility]  PHYSICIAN:  Leonides Sake. Lucia Gaskins, M.D.DATE OF BIRTH:  [birth year]  DATE OF PROCEDURE:  03/03/2014 DATE OF DISCHARGE:                              OPERATIVE REPORT   PREOPERATIVE DIAGNOSIS:  Large Zenker diverticulum.  POSTOPERATIVE DIAGNOSIS:  Large Zenker diverticulum.  OPERATION:  Esophagoscopy with placement of a #14 Savary dilator.  Left cervical excision of Zenker diverticulum.  SURGEON:  Leonides Sake. Lucia Gaskins, M.D.  ASSISTANT SURGEON:  Minna Merritts, M.D.  ANESTHESIA:  General endotracheal.  COMPLICATIONS:  None.  ESTIMATED BLOOD LOSS:  Minimal.  BRIEF CLINICAL NOTE:  Nathan Meadows is a 58 year old gentleman who has had a long problem with some dysphagia as well as regurgitation of previously eaten food.  On preoperative exam with Dr. Verl Blalock, he had a barium swallow, which showed a very large Zenker diverticulum protruding more to the left.  Zenker diverticulum extended probably 4-5 cm below the level of the cricopharyngeus muscle.  Because of the size of the Zenker diverticulum, it was elected to approach this through a neck incision and remove the large diverticular sac and perform a cricopharyngeal myotomy through the neck versus doing this endoscopically.  The patient agreed with this approach.  He was taken to the operating room at this time for excision of Zenker diverticulum, and myotomy of the upper esophageal sphincter.  DESCRIPTION OF PROCEDURE:  After adequate general endotracheal anesthesia, patient received 2 g of Ancef IV preoperatively.  First, direct laryngoscopy and esophagoscopy were performed.  Of note, the patient had a very prominent anterior upper teeth that protruded, a retrognathic mandible.  This made cervical esophagoscopy a little bit difficult and could not pass the cervical  esophagoscope down into the esophagus.  The cervical esophagoscope had a tendency to pass just into the large Zenker diverticulum.  Next, using the laryngoscope, I was able to pass this deep to the cricoid cartilage which was elevated and could identify the opening of the esophagus and was able to pass the Savary guidewire down this without any difficulty.  I subsequently passed the #14 Savary dilator without any difficulty that allowed [place] to identify the upper esophageal sphincter, as well as the esophagus during our neck exploration.  The #14 Savary dilator was left in place.  The patient's neck was then extended and the face turned to the right.  The proposed level of the incision was then marked out and injected with 5 mL of Xylocaine with epinephrine preoperatively.  A transverse incision was made just above the clavicle on the left side of his neck and dissection was carried down through the platysma muscle.  The sternocleidomastoid muscle was identified and retracted laterally.  Dissection was then carried down toward the jugular vein and carotid artery which was retracted laterally.  The omohyoid muscle was identified and retracted superiorly.  We then approached the prevertebral area in the area of the Zenker diverticulum.  Initially dissection was carried out superiorly anteriorly and some muscles were identified that had to be divided. These were cauterized for hemostasis.  Finally, the sac  was identified in the prevertebral area just above the cricopharyngeal musculature.  I was able to easily palpate the #14 Savary dilator without any difficulty.  The sac was dissected out back to its origin just above the cricopharyngeus muscle.  Prior to removing the sac, cricopharyngeal myotomy was performed over the #14 dilator.  This was extended about 2 cm.  Following the cricopharyngeal myotomy, the sac was opened up and was able to visualize the Savary dilator without any  difficulty. Probably the tooth 2/3 of the sac was planned to be removed and use the more proximal 1/3 of the sac for closure.  Initial suture was placed inferiorly and a #3 chromic with atraumatic needle was used for closure and inverting the edges of the sac.  The portion of the fact that was removed, was sent to Pathology.  Then, using canals stitch, the edges of the sac were inverted.  The second interrupted layer of 3-0 chromic sutures were placed over the primary closure site.  Hemostasis was obtained with cautery.  Wound was irrigated with saline, and then a #10 flat JP drain was brought through a separate stab incision inferior to our primary incision.  This was secured to the skin with a 3-0 nylon suture.  The wound was irrigated.  Prior to closing the incision, the #14 Savary dilator was removed and a nasogastric tube was passed under direct visualization of the closed wound and the NG tube was easily palpable in the upper esophagus after it was passed into the stomach. The neck was again irrigated with saline.  Hemostasis was obtained and the neck was closed with interrupted 2-0 chromic sutures, reapproximate the platysma muscle and then 3-0 subcutaneous stitches were placed with 3-0 chromic and then the skin was reapproximated with a 5-0 nylon suture.  Bacitracin ointment and dressing were applied.  The drain was placed to bulb suction.  The patient was awoke from anesthesia and transferred to recovery room, postop doing well with good voice.  DISPOSITION:  We will plan admitting him to the hospital for 4-5 days for observation, for any kind of leak, and will plan on beginning p.o. diet in 4-5 days, depending no fever or complications on.          ______________________________ Leonides Sake. Lucia Gaskins, M.D.     CEN/MEDQ  D:  03/03/2014  T:  03/04/2014  Job:  [id number]  cc:   Minna Merritts, M.D. Loralee Pacas. Sharlett Iles, MD, Quentin Ore, [place]

## 2014-03-04 NOTE — Progress Notes (Signed)
INITIAL NUTRITION ASSESSMENT  DOCUMENTATION CODES Per approved criteria  -Obesity Unspecified   INTERVENTION: Discontinue Jevity 1.2. Initiate Jevity 1.5 via NGT at 25 ml/hr and increase by 10 ml every 4 hours to get to goal rate of 55 ml/hr with 30 ml Prostat daily to provide 2080 kcals, 99 grams of protein, and 1003 ml of free water.  NUTRITION DIAGNOSIS: Inadequate oral intake related to inability to eat as evidenced by NPO.   Goal: Pt to meet >/= 90% of their estimated nutrition needs.  Monitor:  TF rate and tolerance, diet advancement, weight trends, labs, I/O's  Reason for Assessment: MD consult to adjust tube feeding formula and rate  58 y.o. male  Admitting Dx: S/p Zenker's Diverticulum  ASSESSMENT: Nathan Meadows is a 58 y.o. male with PMH of diverticulosis of colon, GERD, iron deficiency anemia, pre-diabetes, and hyperlipidemia, who presents for evaluation of dysphagia and regurgitation. He had a Barium Swallow which showed a large Zenker's Diverticulum. He is taken to the OR for treatment of the Zenker's Diverticulum.   3/4-PROCEDURE: Procedure(s):  EXCISION OF ZENKER'S DIVERTICULUM (N/A)  ESOPHAGOSCOPY (N/A)  ESOPHAGEAL DILATION (N/A)  Nutrition management was consulted for TF adjustment and management.  Pt reports usual body weight of 215 lbs. Pt reports his throat is currently sore. Pt with a good appetite and is hoping he can start some POs tomorrow after the esophagram evaluation. Pt with no significant fat or muscle mass loss.  Current tube feeding Jevity 1.2 running at 20 ml/hr providing 576 kcals (29 of estimated minimum kcal needs), 27 grams of protein (28% of estimated minimum protein needs), and 387 ml of free water.  Height: Ht Readings from Last 1 Encounters:  03/03/14 5\' 11"  (1.803 m)    Weight: Wt Readings from Last 1 Encounters:  03/03/14 219 lb (99.338 kg)    Ideal Body Weight: 172 lb  % Ideal Body Weight: 127%  Wt Readings from Last 10  Encounters:  03/03/14 219 lb (99.338 kg)  03/03/14 219 lb (99.338 kg)  03/01/14 219 lb (99.338 kg)  02/26/14 217 lb 3.2 oz (98.521 kg)  02/05/14 217 lb (98.431 kg)  02/02/14 217 lb 4 oz (98.544 kg)  10/01/13 214 lb (97.07 kg)  04/01/13 221 lb (100.245 kg)  10/01/12 217 lb (98.431 kg)  03/26/12 226 lb (102.513 kg)    Usual Body Weight: 215 lb  % Usual Body Weight: 102%  BMI:  Body mass index is 30.56 kg/(m^2). Obese class I  Estimated Nutritional Needs: Kcal: 2000-2200 Protein: 95-105 grams Fluid: 2 L -2.2 L/day  Skin: Incision on lip, incision on left neck  Diet Order: NPO  EDUCATION NEEDS: -No education needs identified at this time   Intake/Output Summary (Last 24 hours) at 03/04/14 1314 Last data filed at 03/04/14 0945  Gross per 24 hour  Intake 1893.33 ml  Output   1955 ml  Net -61.67 ml    Last BM: 3/4   Labs:   Recent Labs Lab 02/26/14 1050  NA 142  K 4.3  CL 104  CO2 28  BUN 11  CREATININE 1.08  CALCIUM 9.3  GLUCOSE 139*    CBG (last 3)   Recent Labs  03/03/14 1206 03/03/14 1551 03/04/14 1221  GLUCAP 87 93 124*    Scheduled Meds: . bacitracin  1 application Topical 3 times per day  .  ceFAZolin (ANCEF) IV  1 g Intravenous Q8H  . ondansetron (ZOFRAN) IV  4 mg Intravenous 3 times per day  Continuous Infusions: . dextrose 5 % and 0.45 % NaCl with KCl 20 mEq/L 100 mL/hr at 03/04/14 0536  . feeding supplement (JEVITY 1.2 CAL) 1,000 mL (03/04/14 1207)    Past Medical History  Diagnosis Date  . Diverticulosis of colon (without mention of hemorrhage)   . GERD (gastroesophageal reflux disease)   . Hiatal hernia   . Iron deficiency anemia   . Anxiety   . Arthritis   . Pre-diabetes   . Shingles   . HLD (hyperlipidemia)   . Diabetes mellitus without complication     pre    no meds    Past Surgical History  Procedure Laterality Date  . Colonoscopy  multiple    Diverticulosis  . Esophagogastroduodenoscopy  multiple    HH  and GERD   . Medial collateral ligament and lateral collateral ligament repair, knee Right     Kallie Locks Dietetic Intern Pager: [phone]  I agree with the above information and made appropriate revisions. Inda Coke MS, RD, LDN Inpatient Registered Dietitian Pager: [phone] After-hours pager: [phone]

## 2014-03-05 ENCOUNTER — Inpatient Hospital Stay (HOSPITAL_COMMUNITY): Payer: PRIVATE HEALTH INSURANCE

## 2014-03-05 LAB — GLUCOSE, CAPILLARY
GLUCOSE-CAPILLARY: 116 mg/dL — AB (ref 70–99)
Glucose-Capillary: 128 mg/dL — ABNORMAL HIGH (ref 70–99)

## 2014-03-05 MED ORDER — LORAZEPAM 2 MG/ML IJ SOLN
0.5000 mg | Freq: Four times a day (QID) | INTRAMUSCULAR | Status: DC
  Administered 2014-03-05 (×2): 0.5 mg via INTRAVENOUS
  Filled 2014-03-05 (×2): qty 1

## 2014-03-05 MED ORDER — IOHEXOL 300 MG/ML  SOLN
150.0000 mL | Freq: Once | INTRAMUSCULAR | Status: AC | PRN
  Administered 2014-03-05: 125 mL via ORAL

## 2014-03-05 MED ORDER — HYDROCODONE-ACETAMINOPHEN 7.5-325 MG/15ML PO SOLN: 10.0000 mL | ORAL | Status: DC | PRN

## 2014-03-05 NOTE — Progress Notes (Signed)
POD 2 AF VSS  Minimal pain but complains about NG tube discomfort. JP output yesterday 33 cc. Minimal clear drainage in bulb. S/P esophagram shows no leak and no residual diverticulum NG bolus feeding given 200 cc and NG removed. Will start sips of clear liquid today and full liquid diet in AM Will plan discharge tomorrow if no problems. Continue Ancef and will remove JP in AM

## 2014-03-05 NOTE — Progress Notes (Signed)
0620 Patient residual was 24ml but did not want tube feeding increase to 61ml/hr. Patient states that it is going fast enough for now.

## 2014-03-06 MED ORDER — BACITRACIN ZINC 500 UNIT/GM EX OINT: 1.0000 "application " | TOPICAL_OINTMENT | Freq: Three times a day (TID) | CUTANEOUS | Status: DC

## 2014-03-06 MED ORDER — HYDROCODONE-ACETAMINOPHEN 5-300 MG PO TABS: 1.0000 | ORAL_TABLET | ORAL | Status: DC | PRN

## 2014-03-06 NOTE — Discharge Instructions (Signed)
Resume your regular meds Tylenol , motrin or hydrocodone 5mg  1-2 prn pain Call office if any fever over 101 Soft diet OK to get incision site wet in 12 hrs Return to office on Tues at 4 Call office if any problems   [phone]

## 2014-03-06 NOTE — Progress Notes (Signed)
POD 3 AF VSS JP minimal output and removed Neck wound doing well Path showed inflamed mucosa and no evidence of malignancy Tolerating pos well Plan to discharge home and follow up in 4 days Continue home meds and tylenol or hydrocodone prn pain. Discharge dicated    [phone]

## 2014-03-06 NOTE — Discharge Summary (Signed)
[name], [name]                ACCOUNT NO.:  [id number]  MEDICAL RECORD NO.:  [id number]  LOCATION:  6N11C                        FACILITY:  [facility]  PHYSICIAN:  Nathan Meadows, M.D.DATE OF BIRTH:  [birth year]  DATE OF ADMISSION:  03/03/2014 DATE OF DISCHARGE:  03/06/2014                              DISCHARGE SUMMARY   DIAGNOSES: 1. Zenker diverticulum. 2. Non-insulin-dependent diabetes.  OPERATIONS IN THIS HOSPITALIZATION:  Esophagoscopy with a cervical excision of Zenker diverticulum on March 03, 2014.  CONSULTS:  Dietary consult for NG-tube feedings.  HOSPITAL COURSE:  Nathan Meadows is a 58 year old gentleman who was admitted to the hospital via the operating room on March 03, 2014 for cervical excision of a large Zenker diverticulum.  He has been having problems with dysphagia and regurgitation for 6 or 7 years.  He was recently diagnosed with a large Zenker diverticulum on barium swallow. He was taken to operating room at this time for cervical excision.  He tolerated this well with no complications.  He received perioperative Ancef and postoperative Ancef for antibiotic coverage.  Two days following his surgery, he had a water-soluble esophagram which showed no leakage of fluid, and resolution of the large Zenker's.  He was subsequently started on a p.o. diet on his third postoperative day and tolerated this well.  He remained afebrile throughout his hospitalization and subsequently discharged home on his third postoperative day.  His JP drain had minimal output.  It was also removed on the third postoperative day prior to discharge.  Final pathology report revealed just inflamed mucosa with no evidence of malignancy.  DISPOSITION:  Patient is discharged home on his regular medications in addition to Tylenol or hydrocodone 5 mg 1-2 q.4 hours p.r.n. pain.  I will have him follow up in my office in 3 days for recheck and for removal of sutures.  Incision was healing  well at the time of discharge.          ______________________________ Nathan Meadows, M.D.     CEN/MEDQ  D:  03/06/2014  T:  03/06/2014  Job:  [id number]  cc:   Loralee Pacas. Sharlett Iles, MD, Quentin Ore, [place]

## 2014-03-30 ENCOUNTER — Ambulatory Visit (INDEPENDENT_AMBULATORY_CARE_PROVIDER_SITE_OTHER): Payer: PRIVATE HEALTH INSURANCE | Admitting: Internal Medicine

## 2014-03-30 ENCOUNTER — Encounter: Payer: Self-pay | Admitting: Internal Medicine

## 2014-03-30 VITALS — BP 120/84 | HR 72 | Ht 71.0 in | Wt 212.5 lb

## 2014-03-30 DIAGNOSIS — K648 Other hemorrhoids: Secondary | ICD-10-CM

## 2014-03-30 NOTE — Patient Instructions (Addendum)
HEMORRHOID BANDING PROCEDURE    FOLLOW-UP CARE   1. The procedure you have had should have been relatively painless since the banding of the area involved does not have nerve endings and there is no pain sensation.  The rubber band cuts off the blood supply to the hemorrhoid and the band may fall off as soon as 48 hours after the banding (the band may occasionally be seen in the toilet bowl following a bowel movement). You may notice a temporary feeling of fullness in the rectum which should respond adequately to plain Tylenol or Motrin.  2. Following the banding, avoid strenuous exercise that evening and resume full activity the next day.  A sitz bath (soaking in a warm tub) or bidet is soothing, and can be useful for cleansing the area after bowel movements.     3. To avoid constipation, take two tablespoons of natural wheat bran, natural oat bran, flax, Benefiber or any over the counter fiber supplement and increase your water intake to 7-8 glasses daily.    4. Unless you have been prescribed anorectal medication, do not put anything inside your rectum for two weeks: No suppositories, enemas, fingers, etc.  5. Occasionally, you may have more bleeding than usual after the banding procedure.  This is often from the untreated hemorrhoids rather than the treated one.  Don't be concerned if there is a tablespoon or so of blood.  If there is more blood than this, lie flat with your bottom higher than your head and apply an ice pack to the area. If the bleeding does not stop within a half an hour or if you feel faint, call our office at [phone] or go to the emergency room.  6. Problems are not common; however, if there is a substantial amount of bleeding, severe pain, chills, fever or difficulty passing urine (very rare) or other problems, you should call us at [phone] or report to the nearest emergency room.  7. Do not stay seated continuously for more than 2-3 hours for a day or two  after the procedure.  Tighten your buttock muscles 10-15 times every two hours and take 10-15 deep breaths every 1-2 hours.  Do not spend more than a few minutes on the toilet if you cannot empty your bowel; instead re-visit the toilet at a later time.    We will see you back at your next banding appointment, April 20th at 11:15am.   I appreciate the opportunity to care for you.

## 2014-03-30 NOTE — Assessment & Plan Note (Signed)
RA banded RTC 2-4 weeks 

## 2014-03-30 NOTE — Progress Notes (Signed)
Patient ID: Nathan Meadows, male   DOB: 02/08/1956, 58 y.o.   MRN: [id number]       The patient is here for followup. When he was last year he had his left lateral hemorrhoids banded. I thought that that was prolapsing. He had his Zenker's diverticulum repair and reports great success with that surgery. He is having some slight rectal bleeding but says it is better though initially when he was hospitalized for his surgery he had some constipation and then diarrhea and had some bleeding, a few days after the hemorrhoidal banding.  On rectal exam today in the left lateral decubitus position there is another protruding hemorrhoids with prolapse, this may be in the right anterior position or perhaps I was in corrected assessing the previous one is left lateral. It looks fairly similar with a violaceous and erythematous appearance. It is easily reduced.  PROCEDURE NOTE: The patient presents with symptomatic grade 2-3  hemorrhoids, requesting rubber band ligation of his/her hemorrhoidal disease.  All risks, benefits and alternative forms of therapy were described and informed consent was obtained.  The decision was made to band the right anterior internal hemorrhoid, and the [place] was used to perform band ligation without complication.  Digital anorectal examination was then performed to assure proper positioning of the band, and to adjust the banded tissue as required.  The patient was discharged home without pain or other issues.  Dietary and behavioral recommendations were given and along with follow-up instructions.     The following adjunctive treatments were recommended:  Benefiber if needed, he does not sit or straining to stool or have constipation at this time but he was cautioned to avoid that and start Benefiber if so.  The patient will return in 2-4 weeks for  follow-up and possible additional banding as required. No complications were encountered and the patient tolerated the  procedure well.   I appreciate the opportunity to care for this patient. CC: Elyn Peers, MD

## 2014-04-01 ENCOUNTER — Other Ambulatory Visit: Payer: PRIVATE HEALTH INSURANCE | Admitting: Lab

## 2014-04-01 ENCOUNTER — Ambulatory Visit: Payer: PRIVATE HEALTH INSURANCE | Admitting: Hematology & Oncology

## 2014-04-19 ENCOUNTER — Encounter: Payer: Self-pay | Admitting: Internal Medicine

## 2014-04-19 ENCOUNTER — Ambulatory Visit (INDEPENDENT_AMBULATORY_CARE_PROVIDER_SITE_OTHER): Payer: PRIVATE HEALTH INSURANCE | Admitting: Internal Medicine

## 2014-04-19 VITALS — BP 120/80 | HR 64 | Ht 71.0 in | Wt 215.0 lb

## 2014-04-19 DIAGNOSIS — K648 Other hemorrhoids: Secondary | ICD-10-CM

## 2014-04-19 NOTE — Progress Notes (Signed)
   Patient returns noting marked improvement in hemorrhoids - after LL and RA banding. No prolapse sxs and only slight bleeding on paper. Has reduced time on toilet significantly - qd BM w/o straining.  PROCEDURE NOTE: The patient presents with symptomatic grade 2-3  hemorrhoids, requesting rubber band ligation of his/her hemorrhoidal disease.  All risks, benefits and alternative forms of therapy were described and informed consent was obtained.  Rectal exam shows no protruding hemorrhoids today and no anoderm abnormalities.  The decision was made to band the RP internal hemorrhoid, and the [place] was used to perform band ligation without complication.  Digital anorectal examination was then performed to assure proper positioning of the band, and to adjust the banded tissue as required.  The patient was discharged home without pain or other issues.  Dietary and behavioral recommendations were given and along with follow-up instructions.       The patient will return in 2 months for  follow-up and possible additional banding as required. No complications were encountered and the patient tolerated the procedure well.  CB:ULAGT,XMIWO J, MD

## 2014-04-19 NOTE — Assessment & Plan Note (Signed)
RP banded, RTC 2 months

## 2014-04-19 NOTE — Patient Instructions (Signed)
HEMORRHOID BANDING PROCEDURE    FOLLOW-UP CARE   1. The procedure you have had should have been relatively painless since the banding of the area involved does not have nerve endings and there is no pain sensation.  The rubber band cuts off the blood supply to the hemorrhoid and the band may fall off as soon as 48 hours after the banding (the band may occasionally be seen in the toilet bowl following a bowel movement). You may notice a temporary feeling of fullness in the rectum which should respond adequately to plain Tylenol or Motrin.  2. Following the banding, avoid strenuous exercise that evening and resume full activity the next day.  A sitz bath (soaking in a warm tub) or bidet is soothing, and can be useful for cleansing the area after bowel movements.     3. To avoid constipation, take two tablespoons of natural wheat bran, natural oat bran, flax, Benefiber or any over the counter fiber supplement and increase your water intake to 7-8 glasses daily.    4. Unless you have been prescribed anorectal medication, do not put anything inside your rectum for two weeks: No suppositories, enemas, fingers, etc.  5. Occasionally, you may have more bleeding than usual after the banding procedure.  This is often from the untreated hemorrhoids rather than the treated one.  Don't be concerned if there is a tablespoon or so of blood.  If there is more blood than this, lie flat with your bottom higher than your head and apply an ice pack to the area. If the bleeding does not stop within a half an hour or if you feel faint, call our office at [phone] or go to the emergency room.  6. Problems are not common; however, if there is a substantial amount of bleeding, severe pain, chills, fever or difficulty passing urine (very rare) or other problems, you should call us at [phone] or report to the nearest emergency room.  7. Do not stay seated continuously for more than 2-3 hours for a day or two  after the procedure.  Tighten your buttock muscles 10-15 times every two hours and take 10-15 deep breaths every 1-2 hours.  Do not spend more than a few minutes on the toilet if you cannot empty your bowel; instead re-visit the toilet at a later time.    Follow up with [place] in 2 months.   I appreciate the opportunity to care for you.

## 2014-04-30 ENCOUNTER — Other Ambulatory Visit (HOSPITAL_BASED_OUTPATIENT_CLINIC_OR_DEPARTMENT_OTHER): Payer: PRIVATE HEALTH INSURANCE

## 2014-04-30 DIAGNOSIS — D509 Iron deficiency anemia, unspecified: Secondary | ICD-10-CM

## 2014-04-30 LAB — CBC WITH DIFFERENTIAL/PLATELET
BASO%: 0.5 % (ref 0.0–2.0)
Basophils Absolute: 0 10*3/uL (ref 0.0–0.1)
EOS%: 2 % (ref 0.0–7.0)
Eosinophils Absolute: 0.1 10*3/uL (ref 0.0–0.5)
HEMATOCRIT: 45.1 % (ref 38.4–49.9)
HGB: 15.2 g/dL (ref 13.0–17.1)
LYMPH#: 1.1 10*3/uL (ref 0.9–3.3)
LYMPH%: 26.3 % (ref 14.0–49.0)
MCH: 29.7 pg (ref 27.2–33.4)
MCHC: 33.7 g/dL (ref 32.0–36.0)
MCV: 88.1 fL (ref 79.3–98.0)
MONO#: 0.6 10*3/uL (ref 0.1–0.9)
MONO%: 15.6 % — ABNORMAL HIGH (ref 0.0–14.0)
NEUT#: 2.2 10*3/uL (ref 1.5–6.5)
NEUT%: 55.6 % (ref 39.0–75.0)
PLATELETS: 196 10*3/uL (ref 140–400)
RBC: 5.12 10*6/uL (ref 4.20–5.82)
RDW: 14.4 % (ref 11.0–14.6)
WBC: 4 10*3/uL (ref 4.0–10.3)
nRBC: 0 % (ref 0–0)

## 2014-04-30 LAB — FERRITIN CHCC: FERRITIN: 19 ng/mL — AB (ref 22–316)

## 2014-04-30 LAB — IRON AND TIBC CHCC
%SAT: 18 % — ABNORMAL LOW (ref 20–55)
IRON: 70 ug/dL (ref 42–163)
TIBC: 391 ug/dL (ref 202–409)
UIBC: 322 ug/dL (ref 117–376)

## 2014-05-03 ENCOUNTER — Telehealth: Payer: Self-pay | Admitting: *Deleted

## 2014-05-03 ENCOUNTER — Ambulatory Visit (HOSPITAL_BASED_OUTPATIENT_CLINIC_OR_DEPARTMENT_OTHER): Payer: PRIVATE HEALTH INSURANCE | Admitting: Hematology & Oncology

## 2014-05-03 ENCOUNTER — Encounter: Payer: Self-pay | Admitting: Hematology & Oncology

## 2014-05-03 VITALS — BP 126/74 | HR 68 | Temp 98.0°F | Resp 18 | Ht 71.0 in | Wt 214.0 lb

## 2014-05-03 DIAGNOSIS — K649 Unspecified hemorrhoids: Secondary | ICD-10-CM

## 2014-05-03 DIAGNOSIS — D500 Iron deficiency anemia secondary to blood loss (chronic): Secondary | ICD-10-CM

## 2014-05-03 NOTE — Telephone Encounter (Addendum)
Message copied by Lenn Sink on Mon May 03, 2014  8:43 AM ------      Message from: Volanda Napoleon      Created: Sun May 02, 2014  8:26 PM       Call - iron is better bit stil low.  Need feraheme 1020mg  x 1 dose!! Pete ------Left voicemail informing pt that he needs iron infusion and to call our facility to schedule one.

## 2014-05-04 NOTE — Progress Notes (Signed)
Hematology and Oncology Follow Up Visit  Nathan Meadows [id number] 26-Oct-1956 58 y.o. 05/04/2014   Principle Diagnosis:   Recurrent iron deficiency anemia  Current Therapy:   IV iron as indicated     Interim History:  [name] is back for followup. Is doing okay. We see him every 6 months.  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 He went to this well. There were no complications from this.  He did have some hemorrhoid issues. These are taking care of. Hopefully, iron levels will be better in the future. He had iron levels done last week. His ferritin was 19 with an iron saturation 18%. He will be set up for some me IV iron next week.  He's had no problems with his appetite. He's had no cough. His had no shortness of breath. She's had no bleeding other than that with the hemorrhoids.    Medications: Current outpatient prescriptions:ALPRAZolam (XANAX) 1 MG tablet, Take 1 mg by mouth 2 (two) times daily as needed for anxiety. , Disp: , Rfl: ;  bacitracin ointment, Apply 1 application topically every 8 (eight) hours., Disp: 120 g, Rfl: 0;  Multiple Vitamin (MULTIVITAMIN) tablet, Take 1 tablet by mouth daily., Disp: , Rfl: ;  ONGLYZA 2.5 MG TABS tablet, Take 2.5 mg by mouth Daily., Disp: , Rfl:  rosuvastatin (CRESTOR) 5 MG tablet, Take 5 mg by mouth once a week., Disp: , Rfl: ;  ZETIA 10 MG tablet, Take 10 mg by mouth Daily., Disp: , Rfl:   Allergies: No Known Allergies  Past Medical History, Surgical history, Social history, and Family History were reviewed and updated.  Review of Systems: As above  Physical Exam:  height is 5\' 11"  (1.803 m) and weight is 214 lb (97.07 kg). His oral temperature is 98 F (36.7 C). His blood pressure is 126/74 and his pulse is 68. His respiration is 18.   Well-developed and well-nourished white gentleman. Lungs are clear. Cardiac exam regular in rhythm. Abdomen soft. Has good bowel sounds. There is no fluid wave. There is no palpable liver or  spleen tip. Extremities no clubbing cyanosis or edema. Neurological exam no focal deficits.  Lab Results  Component Value Date   WBC 4.0 04/30/2014   HGB 15.2 04/30/2014   HCT 45.1 04/30/2014   MCV 88.1 04/30/2014   PLT 196 04/30/2014     Chemistry      Component Value Date/Time   NA 142 02/26/2014 1050   K 4.3 02/26/2014 1050   CL 104 02/26/2014 1050   CO2 28 02/26/2014 1050   BUN 11 02/26/2014 1050   CREATININE 1.08 02/26/2014 1050      Component Value Date/Time   CALCIUM 9.3 02/26/2014 1050   ALKPHOS 76 04/29/2008 1341   AST 25 04/29/2008 1341   ALT 35 04/29/2008 1341   BILITOT 0.6 04/29/2008 1341         Impression and Plan: Nathan Meadows is  58 year old gentleman with recurrent iron deficiency anemia.  Hopefully, the hemorrhoid issue will be taken care of.  He will get iron. We will then plan to him back in another 6 months. Hopefully, we will find that his iron levels we'll begin to normalize.   Volanda Napoleon, MD 5/5/20156:36 AM

## 2014-05-06 ENCOUNTER — Ambulatory Visit: Payer: PRIVATE HEALTH INSURANCE

## 2014-05-13 ENCOUNTER — Ambulatory Visit (HOSPITAL_BASED_OUTPATIENT_CLINIC_OR_DEPARTMENT_OTHER): Payer: PRIVATE HEALTH INSURANCE

## 2014-05-13 VITALS — BP 131/85 | HR 56 | Temp 98.0°F | Resp 16

## 2014-05-13 DIAGNOSIS — K648 Other hemorrhoids: Secondary | ICD-10-CM

## 2014-05-13 DIAGNOSIS — D509 Iron deficiency anemia, unspecified: Secondary | ICD-10-CM

## 2014-05-13 MED ORDER — SODIUM CHLORIDE 0.9 % IV SOLN
INTRAVENOUS | Status: DC
  Administered 2014-05-13: 14:00:00 via INTRAVENOUS

## 2014-05-13 MED ORDER — FERUMOXYTOL INJECTION 510 MG/17 ML
1020.0000 mg | Freq: Once | INTRAVENOUS | Status: AC
Start: 2014-05-13 — End: 2014-05-13
  Administered 2014-05-13: 1020 mg via INTRAVENOUS
  Filled 2014-05-13: qty 34

## 2014-05-13 NOTE — Patient Instructions (Signed)

## 2014-06-22 ENCOUNTER — Encounter: Payer: Self-pay | Admitting: Gastroenterology

## 2014-06-22 ENCOUNTER — Ambulatory Visit (INDEPENDENT_AMBULATORY_CARE_PROVIDER_SITE_OTHER): Payer: PRIVATE HEALTH INSURANCE | Admitting: Internal Medicine

## 2014-06-22 ENCOUNTER — Encounter: Payer: PRIVATE HEALTH INSURANCE | Admitting: Gastroenterology

## 2014-06-22 VITALS — BP 100/70 | HR 76 | Ht 71.0 in | Wt 213.0 lb

## 2014-06-22 DIAGNOSIS — K648 Other hemorrhoids: Secondary | ICD-10-CM

## 2014-06-22 NOTE — Progress Notes (Signed)
Patient ID: Nathan Meadows, male   DOB: September 05, 1956, 58 y.o.   MRN: [id number]         PROCEDURE NOTE: The patient presents with symptomatic grade 2  hemorrhoids, requesting rubber band ligation of his/her hemorrhoidal disease.  All risks, benefits and alternative forms of therapy were described and informed consent was obtained.   The anorectum was pre-medicated with 0.125% NTG and 5% lidocaine The decision was made to band the LL internal hemorrhoid, and the [place] was used to perform band ligation without complication.  Digital anorectal examination was then performed to assure proper positioning of the band, and to adjust the banded tissue as required.  The patient was discharged home without pain or other issues.  Dietary and behavioral recommendations were given and along with follow-up instructions.     The patient will return in as needed for  follow-up and possible additional banding as required. No complications were encountered and the patient tolerated the procedure well.  Hemorrhoids, internal, with bleeding LL still symptomatic with soreness, protrusion and bleeding. Banded again today He will f/u PRN

## 2014-06-22 NOTE — Assessment & Plan Note (Signed)
LL still symptomatic with soreness, protrusion and bleeding. Banded again today He will f/u PRN

## 2014-06-22 NOTE — Patient Instructions (Signed)
HEMORRHOID BANDING PROCEDURE    FOLLOW-UP CARE   1. The procedure you have had should have been relatively painless since the banding of the area involved does not have nerve endings and there is no pain sensation.  The rubber band cuts off the blood supply to the hemorrhoid and the band may fall off as soon as 48 hours after the banding (the band may occasionally be seen in the toilet bowl following a bowel movement). You may notice a temporary feeling of fullness in the rectum which should respond adequately to plain Tylenol or Motrin.  2. Following the banding, avoid strenuous exercise that evening and resume full activity the next day.  A sitz bath (soaking in a warm tub) or bidet is soothing, and can be useful for cleansing the area after bowel movements.     3. To avoid constipation, take two tablespoons of natural wheat bran, natural oat bran, flax, Benefiber or any over the counter fiber supplement and increase your water intake to 7-8 glasses daily.    4. Unless you have been prescribed anorectal medication, do not put anything inside your rectum for two weeks: No suppositories, enemas, fingers, etc.  5. Occasionally, you may have more bleeding than usual after the banding procedure.  This is often from the untreated hemorrhoids rather than the treated one.  Don't be concerned if there is a tablespoon or so of blood.  If there is more blood than this, lie flat with your bottom higher than your head and apply an ice pack to the area. If the bleeding does not stop within a half an hour or if you feel faint, call our office at [phone] or go to the emergency room.  6. Problems are not common; however, if there is a substantial amount of bleeding, severe pain, chills, fever or difficulty passing urine (very rare) or other problems, you should call us at [phone] or report to the nearest emergency room.  7. Do not stay seated continuously for more than 2-3 hours for a day or two  after the procedure.  Tighten your buttock muscles 10-15 times every two hours and take 10-15 deep breaths every 1-2 hours.  Do not spend more than a few minutes on the toilet if you cannot empty your bowel; instead re-visit the toilet at a later time.   Follow up as needed 

## 2014-06-22 NOTE — Patient Instructions (Signed)
Pt was on Dr Ardis Hughs schedule by mistake and will be cx and rescheduled for today with Dr Carlean Purl for banding

## 2014-10-15 ENCOUNTER — Other Ambulatory Visit: Payer: Self-pay

## 2014-11-03 ENCOUNTER — Other Ambulatory Visit (HOSPITAL_BASED_OUTPATIENT_CLINIC_OR_DEPARTMENT_OTHER): Payer: PRIVATE HEALTH INSURANCE

## 2014-11-03 DIAGNOSIS — D500 Iron deficiency anemia secondary to blood loss (chronic): Secondary | ICD-10-CM

## 2014-11-03 DIAGNOSIS — D509 Iron deficiency anemia, unspecified: Secondary | ICD-10-CM

## 2014-11-03 LAB — CBC WITH DIFFERENTIAL/PLATELET
BASO%: 0.7 % (ref 0.0–2.0)
Basophils Absolute: 0 10*3/uL (ref 0.0–0.1)
EOS%: 3.6 % (ref 0.0–7.0)
Eosinophils Absolute: 0.2 10*3/uL (ref 0.0–0.5)
HCT: 50.7 % — ABNORMAL HIGH (ref 38.4–49.9)
HGB: 16.7 g/dL (ref 13.0–17.1)
LYMPH%: 24.6 % (ref 14.0–49.0)
MCH: 30.7 pg (ref 27.2–33.4)
MCHC: 32.9 g/dL (ref 32.0–36.0)
MCV: 93.2 fL (ref 79.3–98.0)
MONO#: 0.7 10*3/uL (ref 0.1–0.9)
MONO%: 14 % (ref 0.0–14.0)
NEUT#: 2.9 10*3/uL (ref 1.5–6.5)
NEUT%: 57.1 % (ref 39.0–75.0)
Platelets: 190 10*3/uL (ref 140–400)
RBC: 5.43 10*6/uL (ref 4.20–5.82)
RDW: 13.3 % (ref 11.0–14.6)
WBC: 5.2 10*3/uL (ref 4.0–10.3)
lymph#: 1.3 10*3/uL (ref 0.9–3.3)

## 2014-11-03 LAB — RETICULOCYTES (CHCC)
ABS RETIC: 69 10*3/uL (ref 19.0–186.0)
RBC.: 5.31 MIL/uL (ref 4.22–5.81)
RETIC CT PCT: 1.3 % (ref 0.4–2.3)

## 2014-11-03 LAB — FERRITIN CHCC: Ferritin: 231 ng/ml (ref 22–316)

## 2014-11-03 LAB — IRON AND TIBC CHCC
%SAT: 31 % (ref 20–55)
Iron: 104 ug/dL (ref 42–163)
TIBC: 335 ug/dL (ref 202–409)
UIBC: 231 ug/dL (ref 117–376)

## 2014-11-04 ENCOUNTER — Ambulatory Visit (HOSPITAL_BASED_OUTPATIENT_CLINIC_OR_DEPARTMENT_OTHER): Payer: PRIVATE HEALTH INSURANCE | Admitting: Hematology & Oncology

## 2014-11-04 ENCOUNTER — Telehealth: Payer: Self-pay | Admitting: Hematology & Oncology

## 2014-11-04 ENCOUNTER — Encounter: Payer: Self-pay | Admitting: *Deleted

## 2014-11-04 ENCOUNTER — Encounter: Payer: Self-pay | Admitting: Hematology & Oncology

## 2014-11-04 VITALS — BP 138/80 | HR 66 | Temp 97.9°F | Resp 18 | Ht 71.0 in | Wt 219.0 lb

## 2014-11-04 DIAGNOSIS — K648 Other hemorrhoids: Secondary | ICD-10-CM

## 2014-11-04 DIAGNOSIS — D500 Iron deficiency anemia secondary to blood loss (chronic): Secondary | ICD-10-CM

## 2014-11-04 NOTE — Progress Notes (Signed)
Hematology and Oncology Follow Up Visit  Nathan Meadows [id number] [birth year] 58 y.o. 11/04/2014   Principle Diagnosis:   Recurrent iron deficiency anemia  Current Therapy:   IV iron as indicated     Interim History:  [name] is back for followup. Is doing okay. We see him every 6 months.  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 This was back in March of this year. He also had hemorrhoid banding. He had these take care of. He has not noted any problems with bleeding. He's had no fatigue. He is dealing with a lot of family issues right now. Thankfully, he is able to handle all these.  He had iron studies done yesterday. His ferritin was 231. His iron saturation was 31%.  He's had no problems with his appetite. He's had no cough. His had no shortness of breath. He has had no bleeding.  Medications: Current outpatient prescriptions: ALPRAZolam (XANAX) 1 MG tablet, Take 1 mg by mouth 3 (three) times daily as needed for anxiety. , Disp: , Rfl: ;  linagliptin (TRADJENTA) 5 MG TABS tablet, Take 5 mg by mouth daily. , Disp: , Rfl: ;  Multiple Vitamin (MULTIVITAMIN) tablet, Take 1 tablet by mouth daily., Disp: , Rfl: ;  rosuvastatin (CRESTOR) 5 MG tablet, Take 5 mg by mouth once a week., Disp: , Rfl:  ZETIA 10 MG tablet, Take 10 mg by mouth Daily., Disp: , Rfl:   Allergies: No Known Allergies  Past Medical History, Surgical history, Social history, and Family History were reviewed and updated.  Review of Systems: As above  Physical Exam:  height is 5\' 11"  (1.803 m) and weight is 219 lb (99.338 kg). His oral temperature is 97.9 F (36.6 C). His blood pressure is 138/80 and his pulse is 66. His respiration is 18.   Well-developed and well-nourished white gentleman. Lungs are clear. Cardiac exam regular in rhythm. Abdomen soft. Has good bowel sounds. There is no fluid wave. There is no palpable liver or spleen tip. Extremities no clubbing cyanosis or edema. Neurological exam no  focal deficits.  Lab Results  Component Value Date   WBC 5.2 11/03/2014   HGB 16.7 11/03/2014   HCT 50.7* 11/03/2014   MCV 93.2 11/03/2014   PLT 190 11/03/2014     Chemistry      Component Value Date/Time   NA 142 02/26/2014 1050   K 4.3 02/26/2014 1050   CL 104 02/26/2014 1050   CO2 28 02/26/2014 1050   BUN 11 02/26/2014 1050   CREATININE 1.08 02/26/2014 1050      Component Value Date/Time   CALCIUM 9.3 02/26/2014 1050   ALKPHOS 76 04/29/2008 1341   AST 25 04/29/2008 1341   ALT 35 04/29/2008 1341   BILITOT 0.6 04/29/2008 1341         Impression and Plan: Nathan Meadows is  58 year old gentleman with recurrent iron deficiency anemia.he had bleeding from this diverticulum and the hemorrhoids. However, these have been taking care of.  His iron levels are fantastic.  I think that if we see him back in 6 months, and his iron level still look good, then we can probably let him go from the practice.    Nathan Napoleon, MD 11/5/20152:00 PM

## 2014-11-04 NOTE — Telephone Encounter (Signed)
Mailed April,may schedule

## 2014-11-05 ENCOUNTER — Ambulatory Visit: Payer: PRIVATE HEALTH INSURANCE | Admitting: Hematology & Oncology

## 2014-11-05 ENCOUNTER — Other Ambulatory Visit: Payer: PRIVATE HEALTH INSURANCE | Admitting: Lab

## 2015-01-13 ENCOUNTER — Telehealth: Payer: Self-pay | Admitting: Hematology & Oncology

## 2015-01-13 NOTE — Telephone Encounter (Signed)
Pt left message wanting 4-28 at [facility] and move 5-5 to 5-6. I left him message 4-28 is at [facility] and moved 5-5 to 5-6 at 12 pm.

## 2015-04-27 ENCOUNTER — Other Ambulatory Visit (HOSPITAL_BASED_OUTPATIENT_CLINIC_OR_DEPARTMENT_OTHER): Payer: PRIVATE HEALTH INSURANCE

## 2015-04-27 DIAGNOSIS — D500 Iron deficiency anemia secondary to blood loss (chronic): Secondary | ICD-10-CM

## 2015-04-27 DIAGNOSIS — K648 Other hemorrhoids: Secondary | ICD-10-CM

## 2015-04-27 LAB — IRON AND TIBC CHCC
%SAT: 31 % (ref 20–55)
Iron: 100 ug/dL (ref 42–163)
TIBC: 320 ug/dL (ref 202–409)
UIBC: 220 ug/dL (ref 117–376)

## 2015-04-27 LAB — CBC & DIFF AND RETIC
BASO%: 0.2 % (ref 0.0–2.0)
Basophils Absolute: 0 10*3/uL (ref 0.0–0.1)
EOS%: 2.5 % (ref 0.0–7.0)
Eosinophils Absolute: 0.1 10*3/uL (ref 0.0–0.5)
HEMATOCRIT: 47.1 % (ref 38.4–49.9)
HGB: 16.2 g/dL (ref 13.0–17.1)
Immature Retic Fract: 3.6 % (ref 3.00–10.60)
LYMPH%: 24.4 % (ref 14.0–49.0)
MCH: 31.7 pg (ref 27.2–33.4)
MCHC: 34.4 g/dL (ref 32.0–36.0)
MCV: 92.2 fL (ref 79.3–98.0)
MONO#: 0.6 10*3/uL (ref 0.1–0.9)
MONO%: 12.2 % (ref 0.0–14.0)
NEUT%: 60.7 % (ref 39.0–75.0)
NEUTROS ABS: 2.9 10*3/uL (ref 1.5–6.5)
PLATELETS: 186 10*3/uL (ref 140–400)
RBC: 5.11 10*6/uL (ref 4.20–5.82)
RDW: 13.9 % (ref 11.0–14.6)
RETIC %: 1.21 % (ref 0.80–1.80)
RETIC CT ABS: 61.83 10*3/uL (ref 34.80–93.90)
WBC: 4.8 10*3/uL (ref 4.0–10.3)
lymph#: 1.2 10*3/uL (ref 0.9–3.3)

## 2015-04-27 LAB — FERRITIN CHCC: Ferritin: 298 ng/ml (ref 22–316)

## 2015-04-28 ENCOUNTER — Other Ambulatory Visit: Payer: PRIVATE HEALTH INSURANCE

## 2015-04-28 ENCOUNTER — Encounter: Payer: Self-pay | Admitting: *Deleted

## 2015-05-05 ENCOUNTER — Ambulatory Visit: Payer: PRIVATE HEALTH INSURANCE | Admitting: Hematology & Oncology

## 2015-05-06 ENCOUNTER — Ambulatory Visit (HOSPITAL_BASED_OUTPATIENT_CLINIC_OR_DEPARTMENT_OTHER): Payer: PRIVATE HEALTH INSURANCE | Admitting: Family

## 2015-05-06 ENCOUNTER — Encounter: Payer: Self-pay | Admitting: Family

## 2015-05-06 VITALS — BP 126/80 | HR 58 | Temp 97.4°F | Resp 18 | Ht 71.0 in | Wt 220.8 lb

## 2015-05-06 DIAGNOSIS — K648 Other hemorrhoids: Secondary | ICD-10-CM

## 2015-05-06 DIAGNOSIS — D500 Iron deficiency anemia secondary to blood loss (chronic): Secondary | ICD-10-CM

## 2015-05-06 NOTE — Progress Notes (Signed)
Hematology and Oncology Follow Up Visit  Nathan Meadows [id number] 1956/11/30 59 y.o. 05/06/2015   Principle Diagnosis:  Recurrent iron deficiency anemia  Current Therapy:   IV iron as indicated    Interim History:  Nathan Meadows is here today for a follow-up. He is doing much better. He had his hemorrhoids banded and a Zenker's diverticulum repair. He is no longer having blood in his stool.  He denies fatigue, fever, chills, n/v, ice cravings, cough, rash, dizziness, SOB, chest pain, palpitations, abdominal pain, constipation, diarrhea, blood in urine.  No swelling, tenderness, numbness or tingling in his extremities. No new aches or pains.  His appetite is great and he is staying hydrated. His weight is stable.  His Ferritin is now up to 298 iron saturation 31% and Hgb is 16.2.   Medications:    Medication List       This list is accurate as of: 05/06/15  1:33 PM.  Always use your most recent med list.               ALPRAZolam 1 MG tablet  Commonly known as:  XANAX  Take 1 mg by mouth 3 (three) times daily as needed for anxiety.     multivitamin tablet  Take 1 tablet by mouth daily.     rosuvastatin 5 MG tablet  Commonly known as:  CRESTOR  Take 5 mg by mouth once a week.     TRADJENTA 5 MG Tabs tablet  Generic drug:  linagliptin  Take 5 mg by mouth daily.     ZETIA 10 MG tablet  Generic drug:  ezetimibe  Take 10 mg by mouth Daily.        Allergies: No Known Allergies  Past Medical History, Surgical history, Social history, and Family History were reviewed and updated.  Review of Systems: All other 10 point review of systems is negative.   Physical Exam:  height is 5\' 11"  (1.803 m) and weight is 220 lb 12.8 oz (100.154 kg). His oral temperature is 97.4 F (36.3 C). His blood pressure is 126/80 and his pulse is 58. His respiration is 18.   Wt Readings from Last 3 Encounters:  05/06/15 220 lb 12.8 oz (100.154 kg)  11/04/14 219 lb (99.338 kg)  06/22/14 213  lb (96.616 kg)    Ocular: Sclerae unicteric, pupils equal, round and reactive to light Ear-nose-throat: Oropharynx clear, dentition fair Lymphatic: No cervical or supraclavicular adenopathy Lungs no rales or rhonchi, good excursion bilaterally Heart regular rate and rhythm, no murmur appreciated Abd soft, nontender, positive bowel sounds MSK no focal spinal tenderness, no joint edema Neuro: non-focal, well-oriented, appropriate affect  Lab Results  Component Value Date   WBC 4.8 04/27/2015   HGB 16.2 04/27/2015   HCT 47.1 04/27/2015   MCV 92.2 04/27/2015   PLT 186 04/27/2015   Lab Results  Component Value Date   FERRITIN 298 04/27/2015   IRON 100 04/27/2015   TIBC 320 04/27/2015   UIBC 220 04/27/2015   IRONPCTSAT 31 04/27/2015   Lab Results  Component Value Date   RETICCTPCT 1.21 04/27/2015   RBC 5.11 04/27/2015   RETICCTABS 61.83 04/27/2015   No results found for: KPAFRELGTCHN, LAMBDASER, KAPLAMBRATIO Lab Results  Component Value Date   IGA 295 11/22/2009   No results found for: Ronnald Ramp, A1GS, A2GS, BETS, BETA2SER, GAMS, MSPIKE, SPEI   Chemistry      Component Value Date/Time   NA 142 02/26/2014 1050   K 4.3 02/26/2014 1050  CL 104 02/26/2014 1050   CO2 28 02/26/2014 1050   BUN 11 02/26/2014 1050   CREATININE 1.08 02/26/2014 1050      Component Value Date/Time   CALCIUM 9.3 02/26/2014 1050   ALKPHOS 76 04/29/2008 1341   AST 25 04/29/2008 1341   ALT 35 04/29/2008 1341   BILITOT 0.6 04/29/2008 1341     Impression and Plan: Nathan Meadows is 59 year old gentleman with recurrent iron deficiency anemia dur to bleeding from a diverticulum and hemorrhoids. He has had these issues repaired and is feeling much better. He asymptomatic at this time.  His iron studies have improved and his Hgb is up to 16.2, MCV 92.  I think at this point he we can let him go from our office. He can follow-up with [place] as needed.  He knows to contact us with any  questions or concerns and we can certainly see him for any future hematologic issues.   Eliezer Bottom, NP 5/6/20161:33 PM

## 2015-08-06 IMAGING — RF DG ESOPHAGUS
13 series · 13 of 13 positions shown · IV contrast (omnipaque)
Comparison: DG ESOPHAGUS dated 02/17/2014

FLUOROSCOPY TIME:  48 seconds

CLINICAL DATA: Status Ramses Bowling diverticulum excision

EXAM:
ESOPHOGRAM/BARIUM SWALLOW
TECHNIQUE: Single contrast examination was performed using 125 mL skull
Omnipaque 300.

[Series 1: run · 1 of 1 slices shown (1 of 13)]
[im 1/1]
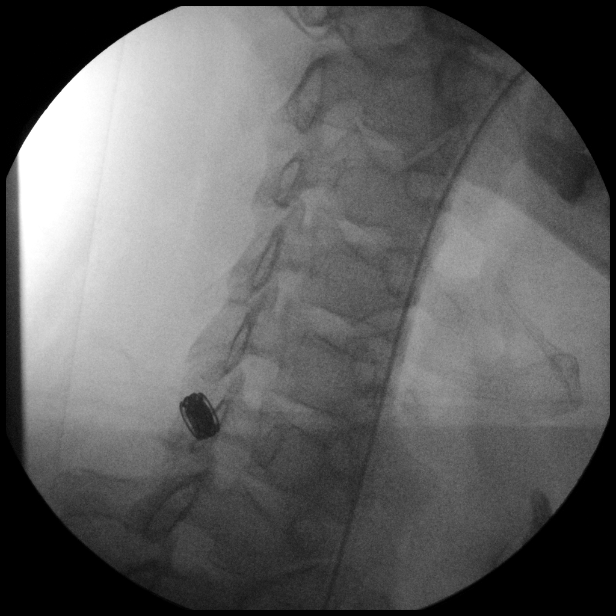

[Series 2: run · 1 of 1 slices shown (2 of 13)]
[im 1/1]
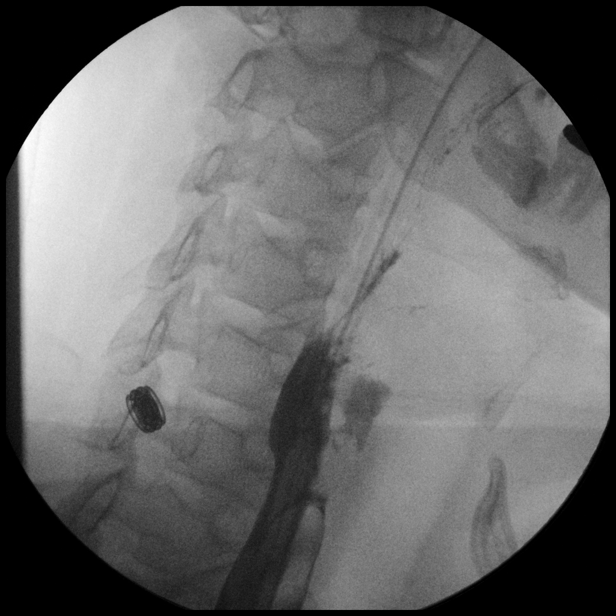

[Series 3: run · 1 of 1 slices shown (3 of 13)]
[im 1/1]
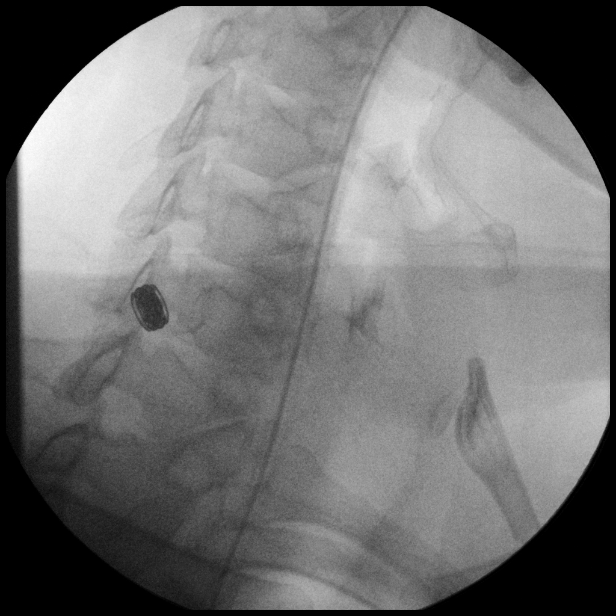

[Series 4: run · 1 of 1 slices shown (4 of 13)]
[im 1/1]
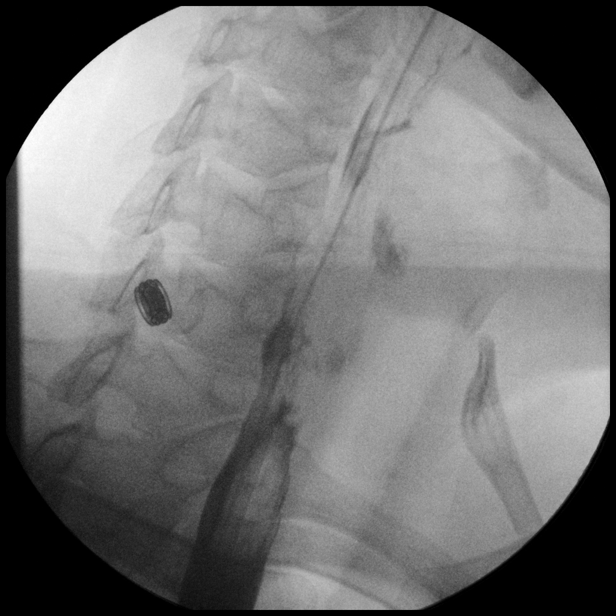

[Series 5: run · 1 of 1 slices shown (5 of 13)]
[im 1/1]
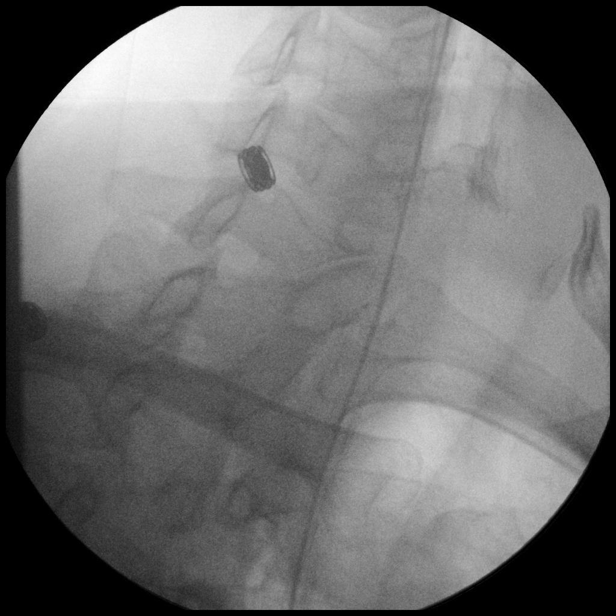

[Series 6: run · 1 of 1 slices shown (6 of 13)]
[im 1/1]
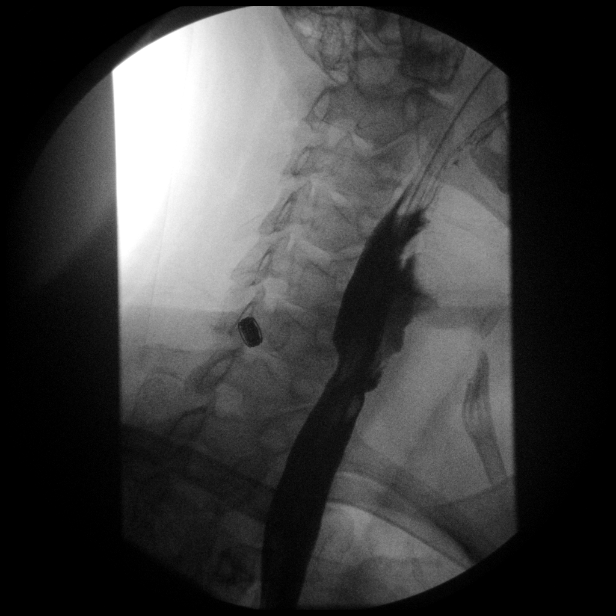

[Series 7: run · 1 of 1 slices shown (7 of 13)]
[im 1/1]
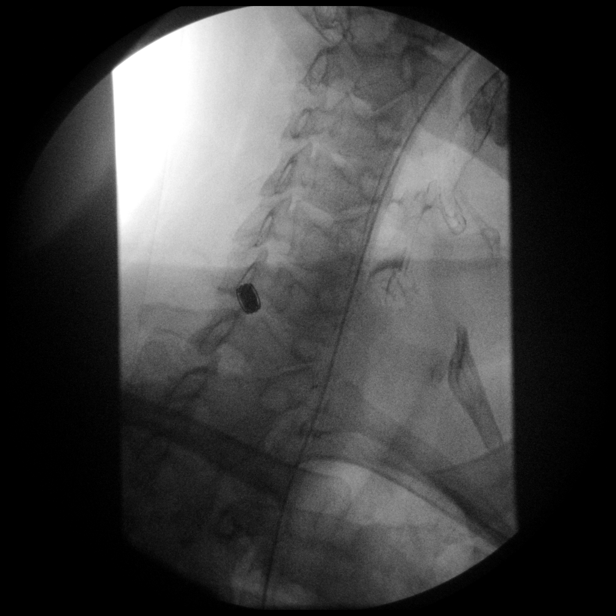

[Series 8: run · 1 of 1 slices shown (8 of 13)]
[im 1/1]
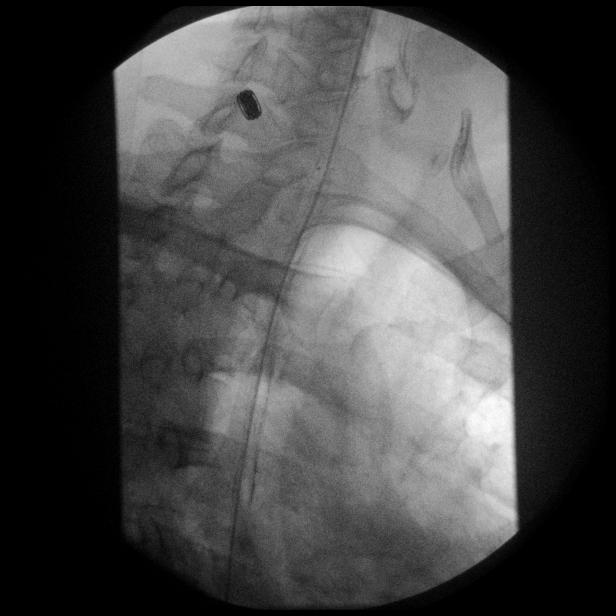

[Series 9: run · 1 of 1 slices shown (9 of 13)]
[im 1/1]
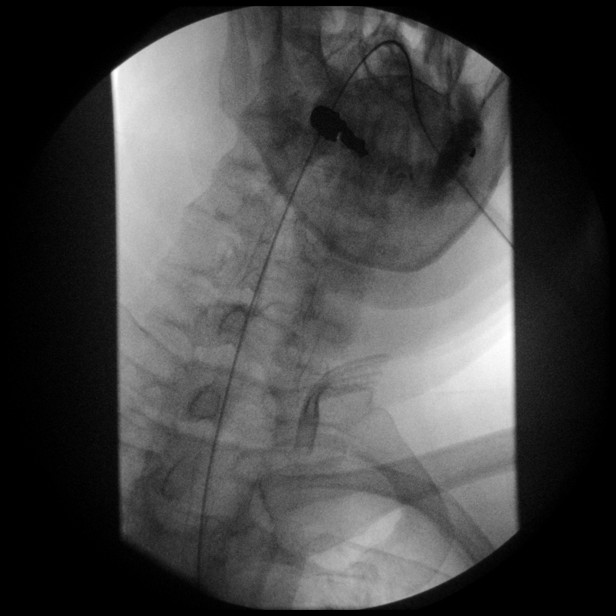

[Series 10: run · 1 of 1 slices shown (10 of 13)]
[im 1/1]
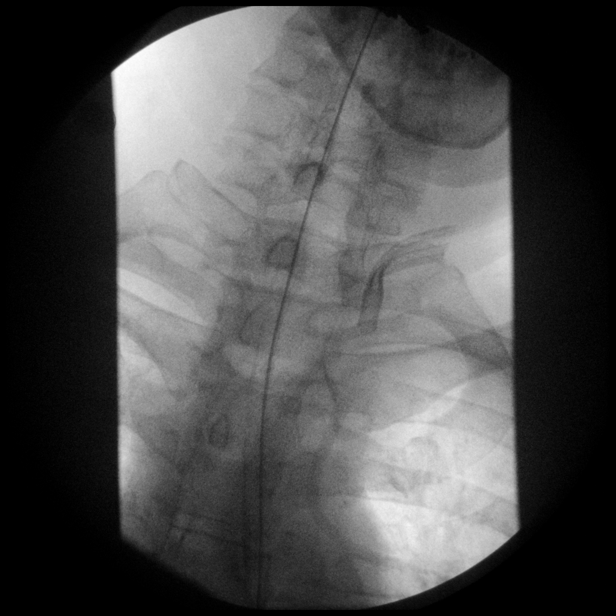

[Series 11: run · 1 of 1 slices shown (11 of 13)]
[im 1/1]
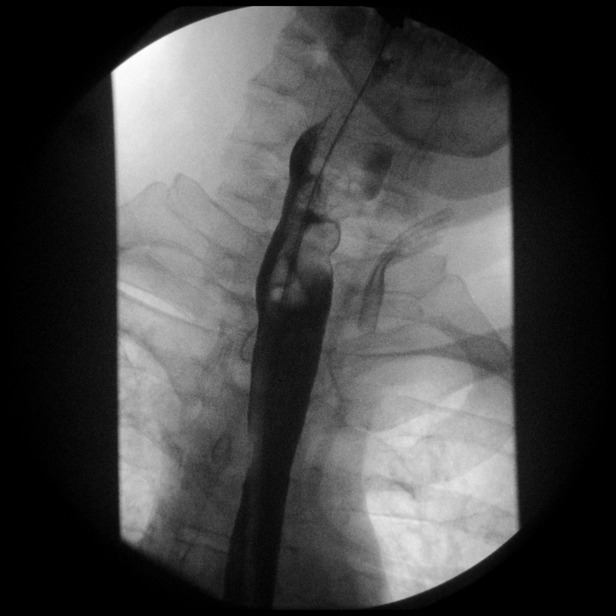

[Series 12: run · 1 of 1 slices shown (12 of 13)]
[im 1/1]
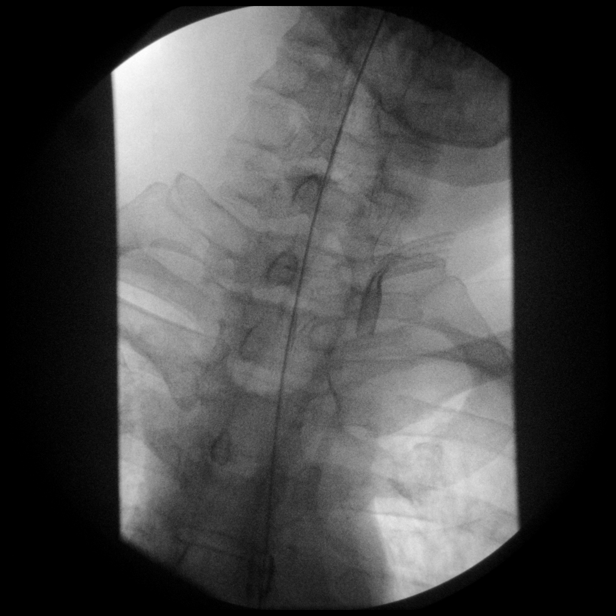

[Series 13: run · 1 of 1 slices shown (13 of 13)]
[im 1/1]
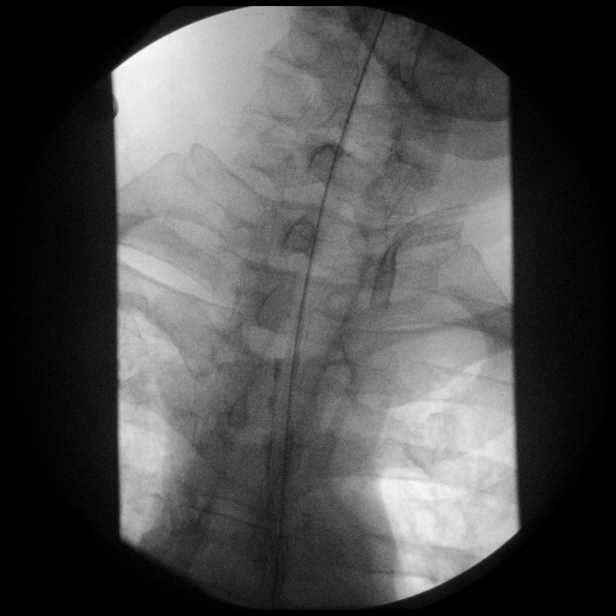

[13 of 13 positions shown; findings below may reference images not displayed]

FINDINGS: Multiple swallows of Omnipaque 300 were taken by the patient in the
semi-upright position in both the frontal and lateral views. There
is a nasogastric tube present. There is no extraluminal contrast
present involving the cervical esophagus to suggest an esophageal
leak. There is no outpouching of the esophagus is suggested a
persistent Jill diverticulum.
IMPRESSION: No evidence of an esophageal leak status Ramses Bowling diverticulum
excision.

## 2016-08-20 ENCOUNTER — Ambulatory Visit (INDEPENDENT_AMBULATORY_CARE_PROVIDER_SITE_OTHER): Payer: No Typology Code available for payment source

## 2016-08-20 ENCOUNTER — Ambulatory Visit (INDEPENDENT_AMBULATORY_CARE_PROVIDER_SITE_OTHER): Payer: No Typology Code available for payment source | Admitting: Podiatry

## 2016-08-20 ENCOUNTER — Encounter: Payer: Self-pay | Admitting: Podiatry

## 2016-08-20 DIAGNOSIS — R52 Pain, unspecified: Secondary | ICD-10-CM

## 2016-08-20 DIAGNOSIS — M722 Plantar fascial fibromatosis: Secondary | ICD-10-CM | POA: Diagnosis not present

## 2016-08-20 MED ORDER — NONFORMULARY OR COMPOUNDED ITEM: 2 refills | Status: DC

## 2016-08-20 NOTE — Progress Notes (Signed)
Subjective:     Patient ID: Nathan Meadows, male   DOB: [birth year], 60 y.o.   MRN: [id number]  HPI 60 year old male presents the office today for concerns of the mass to the bottom of his right foot and was found by his massage therapist as well as pedicures. He states that he has noticed that the masses grown somewhat but denies the pain and swelling. He does not feel and asked me walks. He has had no recent treatment. No other complaints at this time.  Review of Systems  All other systems reviewed and are negative.      Objective:   Physical Exam General: AAO x3, NAD  Dermatological: Skin is warm, dry and supple bilateral. Nails x 10 are well manicured; remaining integument appears unremarkable at this time. There are no open sores, no preulcerative lesions, no rash or signs of infection present.  Vascular: Dorsalis Pedis artery and Posterior Tibial artery pedal pulses are 2/4 bilateral with immedate capillary fill time. Pedal hair growth present.  There is no pain with calf compression, swelling, warmth, erythema.   Neruologic: Grossly intact via light touch bilateral. Vibratory intact via tuning fork bilateral. Protective threshold with Semmes Wienstein monofilament intact to all pedal sites bilateral.   Musculoskeletal: He is a firm, not mobile soft tissue mass present on the right foot on the medial band plantar fascia within the arch of the foot. There is no tenderness palpation along the area at this time. No Weinstein, erythema or increase in warmth. No areas of tenderness bilaterally. No pain, crepitus, or limitation noted with foot and ankle range of motion bilateral. Muscular strength 5/5 in all groups tested bilateral.  Gait: Unassisted, Nonantalgic.      Assessment:     Plantar fibroma right foot    Plan:     -Treatment options discussed including all alternatives, risks, and complications -Etiology of symptoms were discussed -X-rays were obtained and reviewed with the  patient. Consent acute fracture or foreign body identified at this time. -Prescribed compound cream to include verapamil. Stretching exercises daily. Also seen back at 6 weeks of the type symptoms continue discussed steroid injection or sooner if the area becomes painful or increases in size. Call any questions concerning the meantime.  Celesta Gentile, DPM

## 2016-08-20 NOTE — Patient Instructions (Signed)

## 2016-09-28 ENCOUNTER — Ambulatory Visit (INDEPENDENT_AMBULATORY_CARE_PROVIDER_SITE_OTHER): Payer: No Typology Code available for payment source | Admitting: Podiatry

## 2016-09-28 ENCOUNTER — Encounter: Payer: Self-pay | Admitting: Podiatry

## 2016-09-28 DIAGNOSIS — M722 Plantar fascial fibromatosis: Secondary | ICD-10-CM | POA: Diagnosis not present

## 2016-09-28 NOTE — Progress Notes (Signed)
Subjective: 60 year old male presents the office if upper valuation of plantar fibroma to the right foot. He has been using the verapamil cream because of the area is somewhat smaller. He is having no pain in the area and denies any redness or swelling to the area he said no complications of using the cream. Denies any systemic complaints such as fevers, chills, nausea, vomiting. No acute changes since last appointment, and no other complaints at this time.   Objective: AAO x3, NAD DP/PT pulses palpable bilaterally, CRT less than 3 seconds On the medial band of the plantar fashion the right foot continues to be a non-mobile firm soft tissue mass over does appear to be somewhat softer compared to last appointment. There is no edema, erythema or any skin change. Other areas of tenderness. No open lesions or pre-ulcerative lesions.  No pain with calf compression, swelling, warmth, erythema  Assessment: Right foot plantar fibroma  Plan: -All treatment options discussed with the patient including all alternatives, risks, complications.  -Continue verapamil cream daily. Continue stretching exercises. Discussed steroid injection as the mass is still there is minimally improved. He wishes to proceed. Under sterile conditions a makeshift Kenalog and local and a second was infiltrated into the nodule any, occasions. Post injection care was discussed. Follow-up in 4-6 weeks if symptoms continue or masses not resolved. Call any questions or concerns meantime.  Celesta Gentile, DPM

## 2017-05-01 NOTE — Progress Notes (Signed)
This encounter was created in error - please disregard.

## 2019-06-05 ENCOUNTER — Other Ambulatory Visit: Payer: Self-pay | Admitting: Family Medicine

## 2021-10-10 ENCOUNTER — Other Ambulatory Visit: Payer: Self-pay

## 2021-10-10 ENCOUNTER — Encounter: Payer: Self-pay | Admitting: Podiatry

## 2021-10-10 ENCOUNTER — Ambulatory Visit (INDEPENDENT_AMBULATORY_CARE_PROVIDER_SITE_OTHER): Payer: No Typology Code available for payment source

## 2021-10-10 ENCOUNTER — Ambulatory Visit (INDEPENDENT_AMBULATORY_CARE_PROVIDER_SITE_OTHER): Payer: No Typology Code available for payment source | Admitting: Podiatry

## 2021-10-10 DIAGNOSIS — M722 Plantar fascial fibromatosis: Secondary | ICD-10-CM

## 2021-10-10 DIAGNOSIS — R7303 Prediabetes: Secondary | ICD-10-CM | POA: Insufficient documentation

## 2021-10-10 MED ORDER — TRIAMCINOLONE ACETONIDE 10 MG/ML IJ SUSP
10.0000 mg | Freq: Once | INTRAMUSCULAR | Status: AC
  Administered 2021-10-10: 10 mg

## 2021-10-10 NOTE — Patient Instructions (Signed)

## 2021-10-14 NOTE — Progress Notes (Signed)
Subjective:   Patient ID: Nathan Meadows, male   DOB: 65 y.o.   MRN: [id number]   HPI 65 year old male presents the office today for concerns of a soft tissue mass to the arch of his left foot.  I saw him back in 2017 and he had the same issue on his right foot which resolved after an injection and he also had a compound cream that had verapamil included.  He states that symptoms on the left foot started.  Not causing significant discomfort he notes a cramping sensation of the area.  No injury.  No other concerns.   Review of Systems  All other systems reviewed and are negative.  Past Medical History:  Diagnosis Date   Anxiety    Arthritis    Diabetes mellitus without complication ([place])    pre    no meds   Diverticulosis of colon (without mention of hemorrhage)    GERD (gastroesophageal reflux disease)    Hiatal hernia    HLD (hyperlipidemia)    Iron deficiency anemia    Pre-diabetes    Shingles    Zenkers diverticulum     Past Surgical History:  Procedure Laterality Date   COLONOSCOPY  multiple   Diverticulosis   ESOPHAGEAL DILATION N/A 03/03/2014   Procedure: ESOPHAGEAL DILATION;  Surgeon: Rozetta Nunnery, MD;  Location: [place];  Service: ENT;  Laterality: N/A;   ESOPHAGOGASTRODUODENOSCOPY  multiple   HH and GERD    ESOPHAGOSCOPY N/A 03/03/2014   Procedure: ESOPHAGOSCOPY;  Surgeon: Rozetta Nunnery, MD;  Location: [place];  Service: ENT;  Laterality: N/A;   HEMORRHOID BANDING  02/2014   MEDIAL COLLATERAL LIGAMENT AND LATERAL COLLATERAL LIGAMENT REPAIR, KNEE Right    ZENKER'S DIVERTICULECTOMY N/A 03/03/2014   Procedure: EXCISION OF ZENKER'S DIVERTICULUM;  Surgeon: Rozetta Nunnery, MD;  Location: [place];  Service: ENT;  Laterality: N/A;     Current Outpatient Medications:    ALPRAZolam (XANAX) 1 MG tablet, Take 1 mg by mouth 3 (three) times daily as needed for anxiety. , Disp: , Rfl:    JANUVIA 100 MG tablet, Take 100 mg by mouth daily., Disp: , Rfl:    NEXLETOL 180  MG TABS, Take 1 tablet by mouth daily., Disp: , Rfl:    NONFORMULARY OR COMPOUNDED ITEM, [facility]:  Scar Cream - Verapamil 10%, Pentoxifylline 5%, apply 1-2 grams to affected area 3-4 times daily., Disp: 120 each, Rfl: 2   triamcinolone ointment (KENALOG) 0.1 %, SMARTSIG:Sparingly Topical Twice Daily, Disp: , Rfl:    ZETIA 10 MG tablet, Take 10 mg by mouth Daily., Disp: , Rfl:   No Known Allergies        Objective:  Physical Exam  General: AAO x3, NAD  Dermatological: Skin is warm, dry and supple bilateral. There are no open sores, no preulcerative lesions, no rash or signs of infection present.  Vascular: Dorsalis Pedis artery and Posterior Tibial artery pedal pulses are 2/4 bilateral with immedate capillary fill time.  There is no pain with calf compression, swelling, warmth, erythema.   Neruologic: Grossly intact via light touch bilateral.  Negative Tinel sign.  Musculoskeletal: On the medial band plantar fashion the arch of the foot on the left side there is a soft tissue mass which is firm in nature and not mobile consistent with a plantar fibroma.  No significant discomfort on exam today.  No edema, erythema.  No significant mass on the right foot.  Muscular strength 5/5 in all groups tested  bilateral.  Gait: Unassisted, Nonantalgic.       Assessment:   Plantar fibroma left side     Plan:  -Treatment options discussed including all alternatives, risks, and complications -Etiology of symptoms were discussed -X-rays were obtained and reviewed with the patient.  There is no evidence of acute fracture, stress fracture, calcifications. -Patient was to proceed with steroid injection.  Discussed risks of doing so he wants to proceed.  Skin was prepped with alcohol and mixture 1 cc Kenalog 10, 1 cc lidocaine plain was infiltrated into the lesion without any complications.  Postinjection care discussed. -We will order compound cream that will include verapamil.  Discussed  stretching exercises.  Trula Slade DPM

## 2021-10-16 ENCOUNTER — Telehealth: Payer: Self-pay | Admitting: Podiatry

## 2021-10-16 ENCOUNTER — Telehealth: Payer: Self-pay | Admitting: *Deleted

## 2021-10-16 NOTE — Telephone Encounter (Signed)
Patient is calling for status of a scar cream that was supposed to be sent to his pharmacy, not there, going out of town today , will not be back  for 3 weeks.he wanted to know if he can pick up today from pharmacy. Called pharmacy on file, not there. [facility], have the prescription and will reach out to patient and their compound department to speed up the process.  Returned the call to patient and he gave information but he said to have it sent to this [place], [place]. Returned the call to [employer] gave address, said that they will still reach out to patient for payment method.

## 2021-10-16 NOTE — Telephone Encounter (Signed)
Patient called office to notify you he has not received steroid cream in the mail from [facility]. He is going out of town today and can drive to [place] to pick it up if possible. Please advise?  Address while out of town if needs to be mailed: [address], [place] [address]

## 2021-10-24 NOTE — Telephone Encounter (Signed)
Called and left a message for the patient to call and let us know if patient got the prescription from [facility] Nathan Meadows

## 2021-10-25 NOTE — Telephone Encounter (Signed)
Patient called back to let Lattie Haw know that he did receive his medication from [facility] last Wednesday.

## 2022-06-25 ENCOUNTER — Encounter: Payer: Self-pay | Admitting: Internal Medicine

## 2022-06-25 ENCOUNTER — Ambulatory Visit: Payer: No Typology Code available for payment source | Admitting: Internal Medicine

## 2022-06-25 VITALS — BP 142/78 | HR 90 | Wt 230.0 lb

## 2022-06-25 DIAGNOSIS — K603 Anal fistula: Secondary | ICD-10-CM | POA: Diagnosis not present

## 2022-06-25 DIAGNOSIS — K644 Residual hemorrhoidal skin tags: Secondary | ICD-10-CM | POA: Diagnosis not present

## 2022-06-25 DIAGNOSIS — K648 Other hemorrhoids: Secondary | ICD-10-CM

## 2022-06-25 NOTE — Progress Notes (Addendum)
Nathan Meadows 66 y.o. 15-Sep-1956 [id number]  Assessment & Plan:   Encounter Diagnoses  Name Primary?   Anal fistula Yes   Internal and external hemorrhoids without complication     I think he is having bleeding from this anal fistula.  Will refer to colorectal surgery for evaluation and treatment.  Reserve hemorrhoid treatment pending evaluation of fistula, certainly could have banding through surgery if that is thought appropriate.   CC: Renaye Rakers, MD     Subjective:   Chief Complaint: Rectal bleeding question recurrent hemorrhoids  HPI 66 year old white man known to me from prior hemorrhoidal banding back in 2015 with good resolution of bleeding symptoms.  History of iron deficiency anemia related to same.  Colonoscopy and EGD in early 2015 (Dr. Jarold Motto) without other findings to explain anemia.  Recall colonoscopy 2025 planned.  Over the past 6 months he has started to notice small amounts of blood and feces staining his underwear or with wiping.  Bright red.  No bowel habit changes.  This is much less of a problem that he had before I banded his internal hemorrhoids.  No diarrhea no constipation is noted.  No pain.  Social history notable for that he is retired ([employer]) and spends 3 to 90% of his time now at [employer] especially in the summer.  Returns here for doctors visits and haircuts he says. No Known Allergies Current Meds  Medication Sig   ALPRAZolam (XANAX) 1 MG tablet Take 1 mg by mouth 3 (three) times daily as needed for anxiety.    JANUVIA 100 MG tablet Take 100 mg by mouth daily.   NEXLETOL 180 MG TABS Take 1 tablet by mouth daily.   ZETIA 10 MG tablet Take 10 mg by mouth Daily.   Past Medical History:  Diagnosis Date   Anxiety    Arthritis    Diabetes mellitus without complication (HCC)    pre    no meds   Diverticulosis of colon (without mention of hemorrhage)    GERD (gastroesophageal reflux disease)    Hiatal hernia    HLD  (hyperlipidemia)    Iron deficiency anemia    Pre-diabetes    Shingles    Zenkers diverticulum    Past Surgical History:  Procedure Laterality Date   COLONOSCOPY  multiple   Diverticulosis   ESOPHAGEAL DILATION N/A 03/03/2014   Procedure: ESOPHAGEAL DILATION;  Surgeon: Drema Halon, MD;  Location: [facility] OR;  Service: ENT;  Laterality: N/A;   ESOPHAGOGASTRODUODENOSCOPY  multiple   HH and GERD    ESOPHAGOSCOPY N/A 03/03/2014   Procedure: ESOPHAGOSCOPY;  Surgeon: Drema Halon, MD;  Location: [facility] OR;  Service: ENT;  Laterality: N/A;   HEMORRHOID BANDING  02/2014   MEDIAL COLLATERAL LIGAMENT AND LATERAL COLLATERAL LIGAMENT REPAIR, KNEE Right    ZENKER'S DIVERTICULECTOMY N/A 03/03/2014   Procedure: EXCISION OF ZENKER'S DIVERTICULUM;  Surgeon: Drema Halon, MD;  Location: [facility] OR;  Service: ENT;  Laterality: N/A;   Social History   Social History Narrative   Widowed 1 child retired from [employer]   Spends most of his time at [employer] second home   family history includes Diabetes in his brother; Heart attack in his brother and father.   Review of Systems As per HPI  Objective:   Physical Exam BP (!) 142/78   Pulse 90   Wt 230 lb (104.3 kg)   BMI 32.08 kg/m   Well-developed white man in no acute distress  Rectal exam inspection reveals a small anterior (to the left) fistula.  There is nothing draining though the area is somewhat traumatized around.  Digital exam nontender no mass.        Anoscopy is performed and he has grade 1 internal and external hemorrhoids RP left lateral with flatter hemorrhoids in the right anterior position.  There is some stigmata of bleeding from these hemorrhoids.  I do not see any mucosal inflammation or any signs of a fistula tract.

## 2022-07-06 ENCOUNTER — Encounter: Payer: Self-pay | Admitting: Hematology & Oncology

## 2022-08-29 ENCOUNTER — Encounter (HOSPITAL_BASED_OUTPATIENT_CLINIC_OR_DEPARTMENT_OTHER): Payer: Self-pay | Admitting: Surgery

## 2022-08-29 NOTE — Progress Notes (Signed)
08/29/2022. 11:54 AM Spoke w/ via phone for pre-op interview---patient Lab needs dos---- none  unless requested by MD              Lab results------in EPIC COVID test -----patient states asymptomatic no test needed Arrive at -------0530 NPO after MN NO Solid Food.  Clear liquids from MN until---0330 Med rec completed Medications to take morning of surgery -----xanax if needed prior to 0330 Diabetic medication -----hold [place] DOS Patient instructed no nail polish to be worn day of surgery Patient instructed to bring photo id and insurance card day of surgery Patient aware to have Driver (ride ) / caregiver    for 24 hours after surgery home with Significant Other Nathan Meadows Patient Special Instructions -----none Pre-Op special Istructions -----none Patient verbalized understanding of instructions that were given at this phone interview. Patient denies shortness of breath, chest pain, fever, cough at this phone interview.  Nayomi Tabron, [place]

## 2022-09-03 ENCOUNTER — Ambulatory Visit: Payer: Self-pay | Admitting: Surgery

## 2022-09-03 DIAGNOSIS — Z01818 Encounter for other preprocedural examination: Secondary | ICD-10-CM

## 2022-09-14 ENCOUNTER — Ambulatory Visit (HOSPITAL_BASED_OUTPATIENT_CLINIC_OR_DEPARTMENT_OTHER): Payer: No Typology Code available for payment source | Admitting: Certified Registered Nurse Anesthetist

## 2022-09-14 ENCOUNTER — Other Ambulatory Visit: Payer: Self-pay

## 2022-09-14 ENCOUNTER — Encounter (HOSPITAL_BASED_OUTPATIENT_CLINIC_OR_DEPARTMENT_OTHER): Payer: Self-pay | Admitting: Surgery

## 2022-09-14 ENCOUNTER — Encounter (HOSPITAL_BASED_OUTPATIENT_CLINIC_OR_DEPARTMENT_OTHER)
Admission: RE | Disposition: A | Discharge status: Home / Self Care | Payer: Self-pay | Source: Home / Self Care | Attending: Surgery

## 2022-09-14 ENCOUNTER — Ambulatory Visit (HOSPITAL_BASED_OUTPATIENT_CLINIC_OR_DEPARTMENT_OTHER)
Admission: RE | Admit: 2022-09-14 | Discharge: 2022-09-14 | Disposition: A | Discharge status: Home / Self Care | Payer: No Typology Code available for payment source | Attending: Surgery | Admitting: Surgery

## 2022-09-14 DIAGNOSIS — E785 Hyperlipidemia, unspecified: Secondary | ICD-10-CM | POA: Diagnosis not present

## 2022-09-14 DIAGNOSIS — R7303 Prediabetes: Secondary | ICD-10-CM | POA: Diagnosis not present

## 2022-09-14 DIAGNOSIS — K603 Anal fistula: Secondary | ICD-10-CM

## 2022-09-14 DIAGNOSIS — Z8719 Personal history of other diseases of the digestive system: Secondary | ICD-10-CM | POA: Insufficient documentation

## 2022-09-14 DIAGNOSIS — Z01818 Encounter for other preprocedural examination: Secondary | ICD-10-CM

## 2022-09-14 HISTORY — PX: RECTAL EXAM UNDER ANESTHESIA: SHX6399

## 2022-09-14 HISTORY — PX: FISTULOTOMY: SHX6413

## 2022-09-14 LAB — GLUCOSE, CAPILLARY
Glucose-Capillary: 118 mg/dL — ABNORMAL HIGH (ref 70–99)
Glucose-Capillary: 113 mg/dL — ABNORMAL HIGH (ref 70–99)

## 2022-09-14 LAB — CBC WITH DIFFERENTIAL/PLATELET
Abs Immature Granulocytes: 0.02 10*3/uL (ref 0.00–0.07)
Basophils Absolute: 0.1 10*3/uL (ref 0.0–0.1)
Basophils Relative: 1 %
Eosinophils Absolute: 0.1 10*3/uL (ref 0.0–0.5)
Eosinophils Relative: 2 %
HCT: 48.1 % (ref 39.0–52.0)
Hemoglobin: 16.3 g/dL (ref 13.0–17.0)
Immature Granulocytes: 0 %
Lymphocytes Relative: 22 %
Lymphs Abs: 1.1 10*3/uL (ref 0.7–4.0)
MCH: 31 pg (ref 26.0–34.0)
MCHC: 33.9 g/dL (ref 30.0–36.0)
MCV: 91.6 fL (ref 80.0–100.0)
Monocytes Absolute: 0.7 10*3/uL (ref 0.1–1.0)
Monocytes Relative: 13 %
Neutro Abs: 3.1 10*3/uL (ref 1.7–7.7)
Neutrophils Relative %: 62 %
Platelets: 182 10*3/uL (ref 150–400)
RBC: 5.25 MIL/uL (ref 4.22–5.81)
RDW: 13.6 % (ref 11.5–15.5)
WBC: 5 10*3/uL (ref 4.0–10.5)
nRBC: 0 % (ref 0.0–0.2)

## 2022-09-14 LAB — COMPREHENSIVE METABOLIC PANEL
ALT: 69 U/L — ABNORMAL HIGH (ref 0–44)
AST: 47 U/L — ABNORMAL HIGH (ref 15–41)
Albumin: 4.4 g/dL (ref 3.5–5.0)
Alkaline Phosphatase: 52 U/L (ref 38–126)
Anion gap: 7 (ref 5–15)
BUN: 16 mg/dL (ref 8–23)
CO2: 25 mmol/L (ref 22–32)
Calcium: 9.3 mg/dL (ref 8.9–10.3)
Chloride: 109 mmol/L (ref 98–111)
Creatinine, Ser: 1.03 mg/dL (ref 0.61–1.24)
GFR, Estimated: >60 mL/min (ref >60)
Glucose, Bld: 118 mg/dL — ABNORMAL HIGH (ref 70–99)
Potassium: 3.9 mmol/L (ref 3.5–5.1)
Sodium: 141 mmol/L (ref 135–145)
Total Bilirubin: 0.9 mg/dL (ref 0.3–1.2)
Total Protein: 7 g/dL (ref 6.5–8.1)

## 2022-09-14 SURGERY — EXAM UNDER ANESTHESIA, RECTUM
Anesthesia: General | Site: Rectum

## 2022-09-14 MED ORDER — ONDANSETRON HCL 4 MG/2ML IJ SOLN
INTRAMUSCULAR | Status: DC | PRN
  Administered 2022-09-14: 4 mg via INTRAVENOUS

## 2022-09-14 MED ORDER — LIDOCAINE 2% (20 MG/ML) 5 ML SYRINGE
INTRAMUSCULAR | Status: DC | PRN
  Administered 2022-09-14: 100 mg via INTRAVENOUS

## 2022-09-14 MED ORDER — OXYCODONE HCL 5 MG/5ML PO SOLN: 5.0000 mg | Freq: Once | ORAL | Status: DC | PRN

## 2022-09-14 MED ORDER — LACTATED RINGERS IV SOLN
INTRAVENOUS | Status: DC
  Administered 2022-09-14: 1000 mL via INTRAVENOUS

## 2022-09-14 MED ORDER — MIDAZOLAM HCL 5 MG/5ML IJ SOLN
INTRAMUSCULAR | Status: DC | PRN
  Administered 2022-09-14: 2 mg via INTRAVENOUS

## 2022-09-14 MED ORDER — DEXAMETHASONE SODIUM PHOSPHATE 10 MG/ML IJ SOLN
INTRAMUSCULAR | Status: DC | PRN
  Administered 2022-09-14: 5 mg via INTRAVENOUS

## 2022-09-14 MED ORDER — FENTANYL CITRATE (PF) 100 MCG/2ML IJ SOLN
INTRAMUSCULAR | Status: AC
  Filled 2022-09-14: qty 2

## 2022-09-14 MED ORDER — BUPIVACAINE LIPOSOME 1.3 % IJ SUSP
INTRAMUSCULAR | Status: DC | PRN
  Administered 2022-09-14: 20 mL

## 2022-09-14 MED ORDER — ACETAMINOPHEN 500 MG PO TABS
ORAL_TABLET | ORAL | Status: AC
  Filled 2022-09-14: qty 2

## 2022-09-14 MED ORDER — PHENYLEPHRINE 80 MCG/ML (10ML) SYRINGE FOR IV PUSH (FOR BLOOD PRESSURE SUPPORT)
PREFILLED_SYRINGE | INTRAVENOUS | Status: DC | PRN
  Administered 2022-09-14: 160 ug via INTRAVENOUS

## 2022-09-14 MED ORDER — FENTANYL CITRATE (PF) 100 MCG/2ML IJ SOLN
INTRAMUSCULAR | Status: DC | PRN
  Administered 2022-09-14 (×2): 50 ug via INTRAVENOUS

## 2022-09-14 MED ORDER — MIDAZOLAM HCL 2 MG/2ML IJ SOLN
INTRAMUSCULAR | Status: AC
  Filled 2022-09-14: qty 2

## 2022-09-14 MED ORDER — PROPOFOL 10 MG/ML IV BOLUS
INTRAVENOUS | Status: DC | PRN
  Administered 2022-09-14: 200 mg via INTRAVENOUS

## 2022-09-14 MED ORDER — BUPIVACAINE LIPOSOME 1.3 % IJ SUSP: 20.0000 mL | Freq: Once | INTRAMUSCULAR | Status: DC

## 2022-09-14 MED ORDER — ROCURONIUM BROMIDE 10 MG/ML (PF) SYRINGE
PREFILLED_SYRINGE | INTRAVENOUS | Status: DC | PRN
  Administered 2022-09-14: 70 mg via INTRAVENOUS

## 2022-09-14 MED ORDER — ACETAMINOPHEN 500 MG PO TABS
1000.0000 mg | ORAL_TABLET | ORAL | Status: AC
  Administered 2022-09-14: 1000 mg via ORAL

## 2022-09-14 MED ORDER — ONDANSETRON HCL 4 MG/2ML IJ SOLN
INTRAMUSCULAR | Status: AC
  Filled 2022-09-14: qty 2

## 2022-09-14 MED ORDER — ONDANSETRON HCL 4 MG/2ML IJ SOLN: 4.0000 mg | Freq: Once | INTRAMUSCULAR | Status: DC | PRN

## 2022-09-14 MED ORDER — CEFAZOLIN SODIUM-DEXTROSE 2-4 GM/100ML-% IV SOLN
INTRAVENOUS | Status: AC
  Filled 2022-09-14: qty 100

## 2022-09-14 MED ORDER — PROPOFOL 10 MG/ML IV BOLUS
INTRAVENOUS | Status: AC
  Filled 2022-09-14: qty 20

## 2022-09-14 MED ORDER — SUGAMMADEX SODIUM 200 MG/2ML IV SOLN
INTRAVENOUS | Status: DC | PRN
  Administered 2022-09-14 (×2): 100 mg via INTRAVENOUS

## 2022-09-14 MED ORDER — DEXAMETHASONE SODIUM PHOSPHATE 10 MG/ML IJ SOLN
INTRAMUSCULAR | Status: AC
  Filled 2022-09-14: qty 1

## 2022-09-14 MED ORDER — BUPIVACAINE-EPINEPHRINE 0.25% -1:200000 IJ SOLN
INTRAMUSCULAR | Status: DC | PRN
  Administered 2022-09-14: 30 mL

## 2022-09-14 MED ORDER — CEFAZOLIN SODIUM-DEXTROSE 2-4 GM/100ML-% IV SOLN
2.0000 g | INTRAVENOUS | Status: AC
  Administered 2022-09-14: 2 g via INTRAVENOUS

## 2022-09-14 MED ORDER — TRAMADOL HCL 50 MG PO TABS: 50.0000 mg | ORAL_TABLET | Freq: Four times a day (QID) | ORAL | 0 refills | Status: AC | PRN

## 2022-09-14 MED ORDER — LIDOCAINE HCL (PF) 2 % IJ SOLN
INTRAMUSCULAR | Status: AC
  Filled 2022-09-14: qty 5

## 2022-09-14 MED ORDER — KETOROLAC TROMETHAMINE 30 MG/ML IJ SOLN: 30.0000 mg | Freq: Once | INTRAMUSCULAR | Status: DC | PRN

## 2022-09-14 MED ORDER — FENTANYL CITRATE (PF) 100 MCG/2ML IJ SOLN: 25.0000 ug | INTRAMUSCULAR | Status: DC | PRN

## 2022-09-14 MED ORDER — ROCURONIUM BROMIDE 10 MG/ML (PF) SYRINGE
PREFILLED_SYRINGE | INTRAVENOUS | Status: AC
  Filled 2022-09-14: qty 10

## 2022-09-14 MED ORDER — PHENYLEPHRINE 80 MCG/ML (10ML) SYRINGE FOR IV PUSH (FOR BLOOD PRESSURE SUPPORT)
PREFILLED_SYRINGE | INTRAVENOUS | Status: AC
  Filled 2022-09-14: qty 10

## 2022-09-14 MED ORDER — OXYCODONE HCL 5 MG PO TABS: 5.0000 mg | ORAL_TABLET | Freq: Once | ORAL | Status: DC | PRN

## 2022-09-14 SURGICAL SUPPLY — 30 items
ABDOMINAL PAD ABD IMPLANT
APL SKNCLS STERI-STRIP NONHPOA (GAUZE/BANDAGES/DRESSINGS) ×2
BENZOIN TINCTURE PRP APPL 2/3 (GAUZE/BANDAGES/DRESSINGS) ×3 IMPLANT
BLADE SURG 15 STRL LF DISP TIS (BLADE) ×3 IMPLANT
BLADE SURG 15 STRL SS (BLADE) ×2
BRIEF MESH DISP LRG (UNDERPADS AND DIAPERS) ×3 IMPLANT
COVER BACK TABLE 60X90IN (DRAPES) ×3 IMPLANT
COVER MAYO STAND STRL (DRAPES) ×3 IMPLANT
DRAPE UTILITY XL STRL (DRAPES) ×3 IMPLANT
GAUZE 4X4 16PLY ~~LOC~~+RFID DBL (SPONGE) ×3 IMPLANT
GAUZE PAD ABD 8X10 STRL (GAUZE/BANDAGES/DRESSINGS) ×3 IMPLANT
GAUZE SPONGE 4X4 12PLY STRL (GAUZE/BANDAGES/DRESSINGS) IMPLANT
GLOVE BIO SURGEON STRL SZ7.5 (GLOVE) ×3 IMPLANT
GLOVE BIOGEL PI IND STRL 8 (GLOVE) ×3 IMPLANT
GOWN STRL REUS W/TWL LRG LVL3 (GOWN DISPOSABLE) ×3 IMPLANT
KIT TURNOVER CYSTO (KITS) ×3 IMPLANT
MESH PANTIES IMPLANT
NEEDLE HYPO 22GX1.5 SAFETY (NEEDLE) ×3 IMPLANT
NS IRRIG 500ML POUR BTL (IV SOLUTION) ×3 IMPLANT
PACK BASIN DAY SURGERY FS (CUSTOM PROCEDURE TRAY) ×3 IMPLANT
PENCIL SMOKE EVACUATOR (MISCELLANEOUS) ×3 IMPLANT
SUT SILK 0 TIES 10X30 (SUTURE) ×3 IMPLANT
SUT SILK 2 0 SH (SUTURE) ×3 IMPLANT
SUT VIC AB 3-0 SH 18 (SUTURE) IMPLANT
SYR BULB IRRIG 60ML STRL (SYRINGE) ×3 IMPLANT
SYR CONTROL 10ML LL (SYRINGE) ×3 IMPLANT
TOWEL OR 17X26 10 PK STRL BLUE (TOWEL DISPOSABLE) ×3 IMPLANT
TRAY DSU PREP LF (CUSTOM PROCEDURE TRAY) ×3 IMPLANT
TUBE CONNECTING 12X1/4 (SUCTIONS) ×3 IMPLANT
YANKAUER SUCT BULB TIP NO VENT (SUCTIONS) ×3 IMPLANT

## 2022-09-14 NOTE — Anesthesia Procedure Notes (Signed)
Procedure Name: Intubation Date/Time: 09/14/2022 7:45 AM  Performed by: Rogers Blocker, CRNAPre-anesthesia Checklist: Patient identified, Emergency Drugs available, Suction available and Patient being monitored Patient Re-evaluated:Patient Re-evaluated prior to induction Oxygen Delivery Method: Circle System Utilized Preoxygenation: Pre-oxygenation with 100% oxygen Induction Type: IV induction Ventilation: Mask ventilation without difficulty Laryngoscope Size: Glidescope and 4 Grade View: Grade I Tube type: Oral Tube size: 7.0 mm Number of attempts: 1 Airway Equipment and Method: Rigid stylet and Video-laryngoscopy Placement Confirmation: ETT inserted through vocal cords under direct vision, positive ETCO2 and breath sounds checked- equal and bilateral Secured at: 23 cm Tube secured with: Tape Dental Injury: Teeth and Oropharynx as per pre-operative assessment

## 2022-09-14 NOTE — Transfer of Care (Signed)
Immediate Anesthesia Transfer of Care Note  Patient: Nathan Meadows  Procedure(s) Performed: RECTAL EXAM UNDER ANESTHESIA WITH INTERROGATION OF ANAL WOUND SUB-CUTANEOUS PERI-ANAL FISTULA REPAIR (Rectum)  Patient Location: PACU  Anesthesia Type:General  Level of Consciousness: awake, alert , oriented and patient cooperative  Airway & Oxygen Therapy: Patient Spontanous Breathing  Post-op Assessment: Report given to RN and Post -op Vital signs reviewed and stable  Post vital signs: Reviewed and stable  Last Vitals:  Vitals Value Taken Time  BP 186/85 09/14/22 0820  Temp    Pulse 79 09/14/22 0822  Resp 13 09/14/22 0822  SpO2 96 % 09/14/22 0822  Vitals shown include unvalidated device data.  Last Pain:  Vitals:   09/14/22 3968  TempSrc: Oral  PainSc: 0-No pain      Patients Stated Pain Goal: 7 (86/48/47 2072)  Complications: No notable events documented.

## 2022-09-14 NOTE — H&P (Signed)
CC: Here today for surgery  HPI: Nathan Meadows is an 66 y.o. male with history of HLD, whom was seen in the office 08/08/22 as a referral by Dr. Carlean Purl for evaluation of anal abscess/fistula.   Colonoscopy 02/05/14 [place] Dr. Sharlett Iles -noted moderate diverticulosis in the descending colon and sigmoid. Internal hemorrhoids. Otherwise normal.  He was referred to see [place] for possible anal fistula. He reports that upon recently 3 to 4 months ago he noticed some spontaneous drainage from the perianal area. He thought this was a hemorrhoid as he had some minor discomfort. He saw GI who has been following him for hemorrhoids in the past. They noted a wound in the perineal area and therefore of recommend he see [place].  He reports no known history of hidradenitis, abscesses elsewhere on his body.  Denies any changes in his health or health hx since we met in the office. States he is ready for surgery.   PMH: HLD  PSH: Zenker's diverticulum removed from esophagus, Dr. 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 He is happily retired, having spent 31 years in the [employer]. Avid golfer. Attends [place] championship annually  Past Medical History:  Diagnosis Date   Anxiety    Arthritis    Diabetes mellitus without complication ([place])    pre    no meds   Diverticulosis of colon (without mention of hemorrhage)    GERD (gastroesophageal reflux disease)    no issues since zenkers dx   HLD (hyperlipidemia)    Iron deficiency anemia    iron infusion in past   Pre-diabetes    A1C 5.6-5.7 avg   Shingles    Zenkers diverticulum     Past Surgical History:  Procedure Laterality Date   COLONOSCOPY  multiple   Diverticulosis   ESOPHAGEAL DILATION N/A 03/03/2014   Procedure: ESOPHAGEAL DILATION;  Surgeon: Rozetta Nunnery, MD;   Location: [facility];  Service: ENT;  Laterality: N/A;   ESOPHAGOGASTRODUODENOSCOPY  multiple   HH and GERD    ESOPHAGOSCOPY N/A 03/03/2014   Procedure: ESOPHAGOSCOPY;  Surgeon: Rozetta Nunnery, MD;  Location: [facility];  Service: ENT;  Laterality: N/A;   HEMORRHOID BANDING  02/2014   MEDIAL COLLATERAL LIGAMENT AND LATERAL COLLATERAL LIGAMENT REPAIR, KNEE Right    ZENKER'S DIVERTICULECTOMY N/A 03/03/2014   Procedure: EXCISION OF ZENKER'S DIVERTICULUM;  Surgeon: Rozetta Nunnery, MD;  Location: [facility];  Service: ENT;  Laterality: N/A;    Family History  Problem Relation Age of Onset   Diabetes Brother    Heart attack Father    Heart attack Brother     Social:  reports that he has never smoked. He has never used smokeless tobacco. He reports current alcohol use of about 2.0 standard drinks of alcohol per week. He reports that he does not use drugs.  Allergies: No Known Allergies  Medications: I have reviewed the patient's current medications.  Results for orders placed or performed during the hospital encounter of 09/14/22 (from the past 48 hour(s))  CBC with Differential/Platelet     Status: None   Collection Time: 09/14/22  6:20 AM  Result Value Ref Range   WBC 5.0 4.0 - 10.5 K/uL   RBC 5.25 4.22 - 5.81 MIL/uL   Hemoglobin 16.3 13.0 - 17.0 g/dL   HCT 48.1 39.0 - 52.0 %  MCV 91.6 80.0 - 100.0 fL   MCH 31.0 26.0 - 34.0 pg   MCHC 33.9 30.0 - 36.0 g/dL   RDW 13.6 11.5 - 15.5 %   Platelets 182 150 - 400 K/uL   nRBC 0.0 0.0 - 0.2 %   Neutrophils Relative % 62 %   Neutro Abs 3.1 1.7 - 7.7 K/uL   Lymphocytes Relative 22 %   Lymphs Abs 1.1 0.7 - 4.0 K/uL   Monocytes Relative 13 %   Monocytes Absolute 0.7 0.1 - 1.0 K/uL   Eosinophils Relative 2 %   Eosinophils Absolute 0.1 0.0 - 0.5 K/uL   Basophils Relative 1 %   Basophils Absolute 0.1 0.0 - 0.1 K/uL   Immature Granulocytes 0 %   Abs Immature Granulocytes 0.02 0.00 - 0.07 K/uL    Comment: Performed at [facility], [place] [address], [place], [place] [address]  Comprehensive metabolic panel     Status: Abnormal   Collection Time: 09/14/22  6:20 AM  Result Value Ref Range   Sodium 141 135 - 145 mmol/L   Potassium 3.9 3.5 - 5.1 mmol/L   Chloride 109 98 - 111 mmol/L   CO2 25 22 - 32 mmol/L   Glucose, Bld 118 (H) 70 - 99 mg/dL    Comment: Glucose reference range applies only to samples taken after fasting for at least 8 hours.   BUN 16 8 - 23 mg/dL   Creatinine, Ser 1.03 0.61 - 1.24 mg/dL   Calcium 9.3 8.9 - 10.3 mg/dL   Total Protein 7.0 6.5 - 8.1 g/dL   Albumin 4.4 3.5 - 5.0 g/dL   AST 47 (H) 15 - 41 U/L   ALT 69 (H) 0 - 44 U/L   Alkaline Phosphatase 52 38 - 126 U/L   Total Bilirubin 0.9 0.3 - 1.2 mg/dL   GFR, Estimated >60 >60 mL/min    Comment: (NOTE) Calculated using the CKD-EPI Creatinine Equation (2021)    Anion gap 7 5 - 15    Comment: Performed at [facility], [place] [address] [place], [place] [address]  Glucose, capillary     Status: Abnormal   Collection Time: 09/14/22  6:23 AM  Result Value Ref Range   Glucose-Capillary 118 (H) 70 - 99 mg/dL    Comment: Glucose reference range applies only to samples taken after fasting for at least 8 hours.    No results found.  ROS - all of the below systems have been reviewed with the patient and positives are indicated with bold text General: chills, fever or night sweats Eyes: blurry vision or double vision ENT: epistaxis or sore throat Allergy/Immunology: itchy/watery eyes or nasal congestion Hematologic/Lymphatic: bleeding problems, blood clots or swollen lymph nodes Endocrine: temperature intolerance or unexpected weight changes Breast: new or changing breast lumps or nipple discharge Resp: cough, shortness of breath, or wheezing CV: chest pain or dyspnea on exertion GI: as per HPI GU: dysuria, trouble voiding, or hematuria MSK: joint pain or joint stiffness Neuro: TIA or stroke symptoms Derm: pruritus  and skin lesion changes Psych: anxiety and depression  PE Blood pressure (!) 145/90, pulse 60, temperature 98.1 F (36.7 C), temperature source Oral, resp. rate 17, height 6' (1.829 m), weight 104.6 kg, SpO2 99 %. Constitutional: NAD; conversant Eyes: Moist conjunctiva Lungs: Normal respiratory effort CV: RRR MSK: Normal range of motion of extremities Psychiatric: Appropriate affect; alert and oriented x3  Results for orders placed or performed during the hospital encounter  of 09/14/22 (from the past 48 hour(s))  CBC with Differential/Platelet     Status: None   Collection Time: 09/14/22  6:20 AM  Result Value Ref Range   WBC 5.0 4.0 - 10.5 K/uL   RBC 5.25 4.22 - 5.81 MIL/uL   Hemoglobin 16.3 13.0 - 17.0 g/dL   HCT 48.1 39.0 - 52.0 %   MCV 91.6 80.0 - 100.0 fL   MCH 31.0 26.0 - 34.0 pg   MCHC 33.9 30.0 - 36.0 g/dL   RDW 13.6 11.5 - 15.5 %   Platelets 182 150 - 400 K/uL   nRBC 0.0 0.0 - 0.2 %   Neutrophils Relative % 62 %   Neutro Abs 3.1 1.7 - 7.7 K/uL   Lymphocytes Relative 22 %   Lymphs Abs 1.1 0.7 - 4.0 K/uL   Monocytes Relative 13 %   Monocytes Absolute 0.7 0.1 - 1.0 K/uL   Eosinophils Relative 2 %   Eosinophils Absolute 0.1 0.0 - 0.5 K/uL   Basophils Relative 1 %   Basophils Absolute 0.1 0.0 - 0.1 K/uL   Immature Granulocytes 0 %   Abs Immature Granulocytes 0.02 0.00 - 0.07 K/uL    Comment: Performed at [facility], [place] [address] [place], [place] [address]  Comprehensive metabolic panel     Status: Abnormal   Collection Time: 09/14/22  6:20 AM  Result Value Ref Range   Sodium 141 135 - 145 mmol/L   Potassium 3.9 3.5 - 5.1 mmol/L   Chloride 109 98 - 111 mmol/L   CO2 25 22 - 32 mmol/L   Glucose, Bld 118 (H) 70 - 99 mg/dL    Comment: Glucose reference range applies only to samples taken after fasting for at least 8 hours.   BUN 16 8 - 23 mg/dL   Creatinine, Ser 1.03 0.61 - 1.24 mg/dL   Calcium 9.3 8.9 - 10.3 mg/dL   Total Protein 7.0 6.5 -  8.1 g/dL   Albumin 4.4 3.5 - 5.0 g/dL   AST 47 (H) 15 - 41 U/L   ALT 69 (H) 0 - 44 U/L   Alkaline Phosphatase 52 38 - 126 U/L   Total Bilirubin 0.9 0.3 - 1.2 mg/dL   GFR, Estimated >60 >60 mL/min    Comment: (NOTE) Calculated using the CKD-EPI Creatinine Equation (2021)    Anion gap 7 5 - 15    Comment: Performed at [facility], [place] [address] [place], [place] [address]  Glucose, capillary     Status: Abnormal   Collection Time: 09/14/22  6:23 AM  Result Value Ref Range   Glucose-Capillary 118 (H) 70 - 99 mg/dL    Comment: Glucose reference range applies only to samples taken after fasting for at least 8 hours.    No results found.  A/P: Nathan Meadows is an 65 y.o. male with hx of HLD here for surgery re: possible anal fistula vs hidradenitis  -The anatomy and physiology of the anal canal was discussed with the patient with associated pictures. The pathophysiology of anal abscess/fistula and hidradenitis was discussed as well -We have reviewed options going forward including further observation vs surgery -anorectal exam under anesthesia with interrogation of perianal fistula/wound. If hidradenitis, excision. If communication with anal canal was found, possibility of fistulotomy versus seton placement.  -The planned procedures, material risks (including, but not limited to, pain, bleeding, infection, scarring, need for blood transfusion, damage to anal sphincter, incontinence of gas and/or stool, need for  additional procedures, recurrence, pneumonia, heart attack, stroke, death) benefits and alternatives to surgery were discussed at length. I noted a good probability that the procedure would help improve their symptoms. The patient's questions were answered to his satisfaction, he voiced understanding and elected to proceed with surgery. Additionally, we discussed typical postoperative expectations and the recovery process.  Nadeen Landau, [facility], [place]

## 2022-09-14 NOTE — Op Note (Signed)
09/14/2022  8:27 AM  PATIENT:  Nathan Meadows  66 y.o. male  Patient Care Team: Lucianne Lei, MD as PCP - General  PRE-OPERATIVE DIAGNOSIS:  Perianal wound  POST-OPERATIVE DIAGNOSIS:  Subcutaneous perianal fistula  PROCEDURE:   Interrogation of perianal wound with primary anal fistulotomy Anorectal exam under anesthesia  SURGEON:  Surgeon(s): Ileana Roup, MD   ANESTHESIA:   local and general  SPECIMEN:  No Specimen  DISPOSITION OF SPECIMEN:  N/A  COUNTS:  Sponge, needle, and instrument counts were reported correct x2 at conclusion.  EBL: 5 mL  Drains: None  PLAN OF CARE: Discharge to home after PACU  PATIENT DISPOSITION:  PACU - hemodynamically stable.  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  Both the internal and external openings are external to the anal sphincter apparatus.  The wound is shallow and does not involve any significant sphincter muscle.  Therefore, a upfront fistulotomy was carried out with marsupialization of the wound edges to decrease the wound burden.  DESCRIPTION: The patient was identified in the preoperative holding area and taken to the OR. SCDs were applied. He then underwent general endotracheal anesthesia without difficulty. The patient was then rolled onto the OR table in the prone jackknife position. Pressure points were then evaluated and padded. Benzoin was applied to the buttocks and they were gently taped apart.  He was then prepped and draped in usual sterile fashion.  A surgical timeout was performed indicating the correct patient, procedure, and positioning.  A perianal block was then created using a dilute mixture of 0.25% Marcaine with epinephrine and Exparel.  After ascertaining an appropriate level of anesthesia had been achieved, a well lubricated digital rectal exam was performed. This demonstrated no palpable abnormalities.  A Hill-Ferguson anoscope was into the anal canal and circumferential inspection  demonstrated healthy appearing anoderm.  Externally there is a evident fistula with both internal and external openings external to the sphincter apparatus.  This is in the left anterior position.  This is easily cannulated with a fistula probe.  Taking care to avoid any false passages, there is no evident communication with the anal canal.  Etiology is likely from a prior abscess or even potentially hidradenitis.  We are able to palpate both the internal and external sphincter muscle and this all appears to be external to it.  We therefore planned an upfront fistulotomy.  The overlying skin is incised using electrocautery.  The subcutaneous tissue was divided with electrocautery.  There is no significant sphincter muscle within the tract.  All granulation tissue was then fulgurated using electrocautery.  The wound edges are marsupialized using 3-0 Vicryl suture to decrease the wound burden.  Hemostasis is achieved.  Additional local anesthetic is infiltrated.  All sponge, needle, and instrument counts are reported correct x2.  A dressing consisting of moist gauze, dry gauze, ABD and mesh underwear was placed.  He was then rolled back onto a stretcher, awakened from anesthesia, extubated, and transferred to recovery in satisfactory condition.  DISPOSITION: PACU in satisfactory condition.

## 2022-09-14 NOTE — Discharge Instructions (Addendum)
ANORECTAL SURGERY: POST OP INSTRUCTIONS  You had a shallow fistula found which was managed by 'opening the wound.' This will allow the wound to heal appropriately from the inside out. In the meantime, shower after bowel movement to assist with hygiene. Cover with clean moist gauze and change at least once if not twice daily. This will allow the wound to heal slightly faster than if it were to dry out.  Shopping list for wound care: 4 x 4 gauze (obtained from any area drug store over the counter) Saline or clean water (over the counter as well)  DIET: Follow a light bland diet the first 24 hours after arrival home, such as soup, liquids, crackers, etc.  Be sure to include lots of fluids daily.  Avoid fast food or heavy meals as your are more likely to get nauseated.  Eat a low fat diet the next few days after surgery.   Some bleeding with bowel movements is expected for the first couple of days but this should stop in between bowel movements  Take your usually prescribed home medications unless otherwise directed. No foreign bodies per rectum for the next 3 months (enemas, etc)  PAIN CONTROL: It is helpful to take an over-the-counter pain medication regularly for the first few days/weeks.  Choose from the following that works best for you: Ibuprofen (Advil, etc) Three '200mg'$  tabs every 6 hours as needed. Acetaminophen (Tylenol, etc) 500-'650mg'$  every 6 hours as needed NOTE: You may take both of these medications together - most patients find it most helpful when alternating between the two (i.e. Ibuprofen at 6am, tylenol at 9am, ibuprofen at 12pm ..Marland Kitchen) A  prescription for pain medication may have been prescribed for you at discharge.  Take your pain medication as prescribed.  If you are having problems/concerns with the prescription medicine, please call us for further advice.  Avoid getting constipated.  Between the surgery and the pain medications, it is common to experience some constipation.   Increasing fluid intake (64oz of water per day) and taking a fiber supplement (such as Metamucil, Citrucel, FiberCon) 1-2 times a day regularly will usually help prevent this problem from occurring.  Take Miralax (over the counter) 1-2x/day while taking a narcotic pain medication. If no bowel movement after 48hours, you may additionally take a laxative like a bottle of Milk of Magnesia which can be purchased over the counter. Avoid enemas.   Watch out for diarrhea.  If you have many loose bowel movements, simplify your diet to bland foods.  Stop any stool softeners and decrease your fiber supplement. If this worsens or does not improve, please call us.  Wash / shower every day.  If you were discharged with a dressing, you may remove this the day after your surgery. You may shower normally, getting soap/water on your wound, particularly after bowel movements.  Soaking in a warm bath filled a couple inches ("Sitz bath") is a great way to clean the area after a bowel movement and many patients find it is a way to soothe the area.  ACTIVITIES as tolerated:   You may resume regular (light) daily activities beginning the next day--such as daily self-care, walking, climbing stairs--gradually increasing activities as tolerated.  If you can walk 30 minutes without difficulty, it is safe to try more intense activity such as jogging, treadmill, bicycling, low-impact aerobics, etc. Refrain from any heavy lifting or straining for the first 2 weeks after your procedure, particularly if your surgery was for hemorrhoids. Avoid activities that  make your pain worse You may drive when you are no longer taking prescription pain medication, you can comfortably wear a seatbelt, and you can safely maneuver your car and apply brakes.  FOLLOW UP in our office Please call [facility] at [phone] to set up an appointment to see your surgeon in the office for a follow-up appointment approximately 2 weeks after your surgery. Make  sure that you call for this appointment the day you arrive home to insure a convenient appointment time.  9. If you have disability or family leave forms that need to be completed, you may have them completed by your primary care physician's office; for return to work instructions, please ask our office staff and they will be happy to assist you in obtaining this documentation   When to call us [phone]: Poor pain control Reactions / problems with new medications (rash/itching, etc)  Fever over 101.5 F (38.5 C) Inability to urinate Nausea/vomiting Worsening swelling or bruising Continued bleeding from incision. Increased pain, redness, or drainage from the incision  The clinic staff is available to answer your questions during regular business hours (8:30am-5pm).  Please don't hesitate to call and ask to speak to one of our nurses for clinical concerns.   A surgeon from [facility] is always on call at the hospitals   If you have a medical emergency, go to the nearest emergency room or call 911.   [facility] [address] [place], [place], [place]  [address] MAIN: [phone] FAX: [phone] [email or web address]    No acetaminophen/Tylenol until after 12:30 pm today if needed.    Post Anesthesia Home Care Instructions  Activity: Get plenty of rest for the remainder of the day. A responsible individual must stay with you for 24 hours following the procedure.  For the next 24 hours, DO NOT: -Drive a car -Paediatric nurse -Drink alcoholic beverages -Take any medication unless instructed by your physician -Make any legal decisions or sign important papers.  Meals: Start with liquid foods such as gelatin or soup. Progress to regular foods as tolerated. Avoid greasy, spicy, heavy foods. If nausea and/or vomiting occur, drink only clear liquids until the nausea and/or vomiting subsides. Call your  physician if vomiting continues.  Special Instructions/Symptoms: Your throat may feel dry or sore from the anesthesia or the breathing tube placed in your throat during surgery. If this causes discomfort, gargle with warm salt water. The discomfort should disappear within 24 hours.        Information for Discharge Teaching: EXPAREL (bupivacaine liposome injectable suspension)   Your surgeon or anesthesiologist gave you EXPAREL(bupivacaine) to help control your pain after surgery.  EXPAREL is a local anesthetic that provides pain relief by numbing the tissue around the surgical site. EXPAREL is designed to release pain medication over time and can control pain for up to 72 hours. Depending on how you respond to EXPAREL, you may require less pain medication during your recovery.  Possible side effects: Temporary loss of sensation or ability to move in the area where bupivacaine was injected. Nausea, vomiting, constipation Rarely, numbness and tingling in your mouth or lips, lightheadedness, or anxiety may occur. Call your doctor right away if you think you may be experiencing any of these sensations, or if you have other questions regarding possible side effects.  Follow all other discharge instructions given to you by your surgeon or nurse. Eat a healthy diet and drink plenty of water or  other fluids.  If you return to the hospital for any reason within 96 hours following the administration of EXPAREL, it is important for health care providers to know that you have received this anesthetic. A teal colored band has been placed on your arm with the date, time and amount of EXPAREL you have received in order to alert and inform your health care providers. Please leave this armband in place for the full 96 hours following administration, and then you may remove the band.

## 2022-09-14 NOTE — Anesthesia Preprocedure Evaluation (Addendum)
Anesthesia Evaluation  Patient identified by MRN, date of birth, ID band Patient awake    Reviewed: Allergy & Precautions, NPO status , Patient's Chart, lab work & pertinent test results  Airway Mallampati: III  TM Distance: <3 FB Neck ROM: Full    Dental no notable dental hx.    Pulmonary neg pulmonary ROS,    Pulmonary exam normal breath sounds clear to auscultation       Cardiovascular negative cardio ROS Normal cardiovascular exam Rhythm:Regular Rate:Normal     Neuro/Psych Anxiety negative neurological ROS     GI/Hepatic negative GI ROS, Neg liver ROS,   Endo/Other  diabetes  Renal/GU negative Renal ROS  negative genitourinary   Musculoskeletal negative musculoskeletal ROS (+)   Abdominal   Peds negative pediatric ROS (+)  Hematology negative hematology ROS (+)   Anesthesia Other Findings   Reproductive/Obstetrics negative OB ROS                            Anesthesia Physical Anesthesia Plan  ASA: 2  Anesthesia Plan: General   Post-op Pain Management: Minimal or no pain anticipated   Induction: Intravenous  PONV Risk Score and Plan: 2 and Ondansetron, Dexamethasone and Treatment may vary due to age or medical condition  Airway Management Planned: Oral ETT and Video Laryngoscope Planned  Additional Equipment:   Intra-op Plan:   Post-operative Plan: Extubation in OR  Informed Consent: I have reviewed the patients History and Physical, chart, labs and discussed the procedure including the risks, benefits and alternatives for the proposed anesthesia with the patient or authorized representative who has indicated his/her understanding and acceptance.     Dental advisory given  Plan Discussed with: CRNA and Surgeon  Anesthesia Plan Comments:        Anesthesia Quick Evaluation

## 2022-09-14 NOTE — Anesthesia Postprocedure Evaluation (Signed)
Anesthesia Post Note  Patient: Nathan Meadows  Procedure(s) Performed: RECTAL EXAM UNDER ANESTHESIA WITH INTERROGATION OF ANAL WOUND SUB-CUTANEOUS PERI-ANAL FISTULA REPAIR (Rectum)     Patient location during evaluation: PACU Anesthesia Type: General Level of consciousness: awake and alert Pain management: pain level controlled Vital Signs Assessment: post-procedure vital signs reviewed and stable Respiratory status: spontaneous breathing, nonlabored ventilation, respiratory function stable and patient connected to nasal cannula oxygen Cardiovascular status: blood pressure returned to baseline and stable Postop Assessment: no apparent nausea or vomiting Anesthetic complications: no   No notable events documented.  Last Vitals:  Vitals:   09/14/22 0845 09/14/22 0900  BP: 134/77 (!) 146/83  Pulse: (!) 57 (!) 56  Resp: (!) 9 11  Temp:    SpO2: 97% 97%    Last Pain:  Vitals:   09/14/22 0900  TempSrc:   PainSc: 0-No pain                 Jenesis Martin S

## 2022-09-17 ENCOUNTER — Encounter (HOSPITAL_BASED_OUTPATIENT_CLINIC_OR_DEPARTMENT_OTHER): Payer: Self-pay | Admitting: Surgery

## 2022-10-11 ENCOUNTER — Encounter: Payer: Self-pay | Admitting: Hematology & Oncology

## 2024-08-19 ENCOUNTER — Encounter: Payer: Self-pay | Admitting: Internal Medicine

## 2024-10-12 ENCOUNTER — Encounter: Payer: Self-pay | Admitting: Internal Medicine

## 2024-10-19 ENCOUNTER — Encounter: Payer: Self-pay | Admitting: Hematology & Oncology

## 2024-10-21 ENCOUNTER — Encounter: Payer: Self-pay | Admitting: Hematology & Oncology

## 2024-10-23 ENCOUNTER — Other Ambulatory Visit: Payer: Self-pay | Admitting: Medical Genetics

## 2024-10-26 ENCOUNTER — Ambulatory Visit (AMBULATORY_SURGERY_CENTER)

## 2024-10-26 VITALS — Ht 72.0 in | Wt 229.0 lb

## 2024-10-26 DIAGNOSIS — Z1211 Encounter for screening for malignant neoplasm of colon: Secondary | ICD-10-CM

## 2024-10-26 MED ORDER — NA SULFATE-K SULFATE-MG SULF 17.5-3.13-1.6 GM/177ML PO SOLN: 1.0000 | Freq: Once | ORAL | 0 refills | Status: AC

## 2024-10-26 NOTE — Progress Notes (Signed)
 No egg or soy allergy known to patient  No issues known to pt with past sedation with any surgeries or procedures (States he does not go to sleep well)  Patient denies ever being told they had issues or difficulty with intubation  No FH of Malignant Hyperthermia Pt is not on diet pills Pt is not on  home 02  Pt is not on blood thinners  Pt denies issues with constipation  No A fib or A flutter Have any cardiac testing pending--No Pt can ambulate  Pt denies use of chewing tobacco Discussed diabetic I weight loss medication holds Discussed NSAID holds Checked BMI Pt instructed to use Singlecare.com or GoodRx for a price reduction on prep  Patient's chart reviewed by Norleen Schillings CNRA prior to previsit and patient appropriate for the [facility].  Pre visit completed and red dot placed by patient's name on their procedure day (on provider's schedule).

## 2024-11-16 ENCOUNTER — Encounter: Payer: Self-pay | Admitting: Hematology & Oncology

## 2024-11-16 NOTE — Progress Notes (Unsigned)
 [facility] History and Physical   Primary Care Physician:  Benjamine Aland, MD   Reason for Procedure:    Encounter Diagnosis  Name Primary?   Colon cancer screening Yes     Plan:    Colonoscopy  The patient was provided an opportunity to ask questions and all were answered. The patient agreed with the plan.    HPI: Nathan Meadows is a 68 y.o. male with a history of bleeding hemorrhoids and a bleeding anal fistula who presents for a repeat screening colonoscopy.  Last examination in 2015 showing diverticulosis and hemorrhoids.   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   Diabetes mellitus without complication (HCC)    pre    no meds   Diverticulosis of colon (without mention of hemorrhage)    GERD (gastroesophageal reflux disease)    no issues since zenkers dx   HLD (hyperlipidemia)    Hypertension    Iron deficiency anemia    iron infusion in past   Pre-diabetes    A1C 5.6-5.7 avg   Shingles    Zenkers diverticulum     Past Surgical History:  Procedure Laterality Date   COLONOSCOPY  multiple   Diverticulosis   ESOPHAGEAL DILATION N/A 03/03/2014   Procedure: ESOPHAGEAL DILATION;  Surgeon: Lonni FORBES Angle, MD;  Location: [facility] OR;  Service: ENT;  Laterality: N/A;   ESOPHAGOGASTRODUODENOSCOPY  multiple   HH and GERD    ESOPHAGOSCOPY N/A 03/03/2014   Procedure: ESOPHAGOSCOPY;  Surgeon: Lonni FORBES Angle, MD;  Location: [facility] OR;  Service: ENT;  Laterality: N/A;   FISTULOTOMY N/A 09/14/2022   Procedure: SUB-CUTANEOUS PERI-ANAL FISTULA REPAIR;  Surgeon: Teresa Lonni HERO, MD;  Location: [facility];  Service: General;  Laterality: N/A;   HEMORRHOID BANDING  02/2014   MEDIAL COLLATERAL LIGAMENT AND LATERAL COLLATERAL LIGAMENT REPAIR, KNEE Right    RECTAL EXAM UNDER ANESTHESIA N/A 09/14/2022   Procedure: RECTAL EXAM UNDER ANESTHESIA WITH INTERROGATION OF ANAL WOUND;  Surgeon:  Teresa Lonni HERO, MD;  Location: [facility];  Service: General;  Laterality: N/A;   ZENKER'S DIVERTICULECTOMY N/A 03/03/2014   Procedure: EXCISION OF ZENKER'S DIVERTICULUM;  Surgeon: Lonni FORBES Angle, MD;  Location: [facility] OR;  Service: ENT;  Laterality: N/A;     Current Outpatient Medications  Medication Sig Dispense Refill   ALPRAZolam  (XANAX ) 1 MG tablet Take 1 mg by mouth 3 (three) times daily as needed for anxiety.      AMLODIPINE-VALSARTAN-HCTZ PO Take 1 tablet by mouth daily. 80-12.5mg      NEXLETOL 180 MG TABS Take 1 tablet by mouth daily.     OVER THE COUNTER MEDICATION Take 1 tablet by mouth daily. For memory. Unsure of name. OTC at [facility]     saxagliptin HCl (ONGLYZA) 5 MG TABS tablet Take 5 mg by mouth daily.     valsartan-hydrochlorothiazide (DIOVAN-HCT) 80-12.5 MG tablet Take 1 tablet by mouth daily.     ZETIA 10 MG tablet Take 10 mg by mouth Daily.     No current facility-administered medications for this visit.    Allergies as of 11/17/2024   (No Known Allergies)    Family History  Problem Relation Age of Onset   Heart attack Father    Diabetes Brother    Heart attack Brother    Colon cancer Neg Hx    Rectal cancer Neg Hx  Stomach cancer Neg Hx    Esophageal cancer Neg Hx     Social History   Socioeconomic History   Marital status: Widowed    Spouse name: Not on file   Number of children: 1   Years of education: Not on file   Highest education level: Not on file  Occupational History   Occupation: [employer]    Employer: [employer]    Comment: retired  Tobacco Use   Smoking status: Never   Smokeless tobacco: Never   Tobacco comments:    never used tobacco  Vaping Use   Vaping status: Never Used  Substance and Sexual Activity   Alcohol use: Yes    Alcohol/week: 2.0 standard drinks of alcohol    Types: 2 Cans of beer per week    Comment: occasional after playing golf with friends   Drug use: No   Sexual activity: Yes     Birth control/protection: None  Other Topics Concern   Not on file  Social History Narrative   Widowed 1 child retired from [employer]   Spends most of his time at [employer] second home   Social Drivers of Health   Financial Resource Strain: Not on file  Food Insecurity: Not on file  Transportation Needs: Not on file  Physical Activity: Not on file  Stress: Not on file  Social Connections: Not on file  Intimate Partner Violence: Not on file    Review of Systems:  All other review of systems negative except as mentioned in the HPI.  Physical Exam: Vital signs There were no vitals taken for this visit.  General:   Alert,  Well-developed, well-nourished, pleasant and cooperative in NAD Lungs:  Clear throughout to auscultation.   Heart:  Regular rate and rhythm; no murmurs, clicks, rubs,  or gallops. Abdomen:  Soft, nontender and nondistended. Normal bowel sounds.  Small soft umbilical hernia Neuro/Psych:  Alert and cooperative. Normal mood and affect. A and O x 3   @Rohaan Durnil  [name], MD, [facility] [phone] (pager) 11/16/2024 5:56 PM@

## 2024-11-17 ENCOUNTER — Encounter: Payer: Self-pay | Admitting: Hematology & Oncology

## 2024-11-17 ENCOUNTER — Encounter: Payer: Self-pay | Admitting: Internal Medicine

## 2024-11-17 ENCOUNTER — Ambulatory Visit: Admitting: Internal Medicine

## 2024-11-17 VITALS — BP 138/77 | HR 55 | Temp 97.6°F | Resp 13 | Ht 72.0 in | Wt 229.0 lb

## 2024-11-17 DIAGNOSIS — K5730 Diverticulosis of large intestine without perforation or abscess without bleeding: Secondary | ICD-10-CM | POA: Diagnosis not present

## 2024-11-17 DIAGNOSIS — Z1211 Encounter for screening for malignant neoplasm of colon: Secondary | ICD-10-CM

## 2024-11-17 DIAGNOSIS — K644 Residual hemorrhoidal skin tags: Secondary | ICD-10-CM | POA: Diagnosis not present

## 2024-11-17 DIAGNOSIS — K648 Other hemorrhoids: Secondary | ICD-10-CM

## 2024-11-17 MED ORDER — SODIUM CHLORIDE 0.9 % IV SOLN: 500.0000 mL | INTRAVENOUS | Status: DC

## 2024-11-17 NOTE — Patient Instructions (Addendum)
 No polyps or cancer were seen.  You do have diverticulosis - thickened muscle rings and pouches in the colon wall. Please read the handout about this condition. Internal hemorrhoids also seen.  I appreciate the opportunity to care for you. Nathan Meadows Nathan Commander, MD, FACG  YOU HAD AN ENDOSCOPIC PROCEDURE TODAY AT THE [facility]:   Refer to the procedure report that was given to you for any specific questions about what was found during the examination.  If the procedure report does not answer your questions, please call your gastroenterologist to clarify.  If you requested that your care partner not be given the details of your procedure findings, then the procedure report has been included in a sealed envelope for you to review at your convenience later.  YOU SHOULD EXPECT: Some feelings of bloating in the abdomen. Passage of more gas than usual.  Walking can help get rid of the air that was put into your GI tract during the procedure and reduce the bloating. If you had a lower endoscopy (such as a colonoscopy or flexible sigmoidoscopy) you may notice spotting of blood in your stool or on the toilet paper. If you underwent a bowel prep for your procedure, you may not have a normal bowel movement for a few days.  Please Note:  You might notice some irritation and congestion in your nose or some drainage.  This is from the oxygen used during your procedure.  There is no need for concern and it should clear up in a day or so.  SYMPTOMS TO REPORT IMMEDIATELY:  Following lower endoscopy (colonoscopy or flexible sigmoidoscopy):  Excessive amounts of blood in the stool  Significant tenderness or worsening of abdominal pains  Swelling of the abdomen that is new, acute  Fever of 100F or higher   For urgent or emergent issues, a gastroenterologist can be reached at any hour by calling [phone]. Do not use MyChart messaging for urgent concerns.    DIET:  We do recommend a small meal at  first, but then you may proceed to your regular diet.  Drink plenty of fluids but you should avoid alcoholic beverages for 24 hours.  ACTIVITY:  You should plan to take it easy for the rest of today and you should NOT DRIVE or use heavy machinery until tomorrow (because of the sedation medicines used during the test).    FOLLOW UP: Our staff will call the number listed on your records the next business day following your procedure.  We will call around 7:15- 8:00 am to check on you and address any questions or concerns that you may have regarding the information given to you following your procedure. If we do not reach you, we will leave a message.     If any biopsies were taken you will be contacted by phone or by letter within the next 1-3 weeks.  Please call us  at [phone] if you have not heard about the biopsies in 3 weeks.    SIGNATURES/CONFIDENTIALITY: You and/or your care partner have signed paperwork which will be entered into your electronic medical record.  These signatures attest to the fact that that the information above on your After Visit Summary has been reviewed and is understood.  Full responsibility of the confidentiality of this discharge information lies with you and/or your care-partner.

## 2024-11-17 NOTE — Progress Notes (Signed)
 Report to PACU, RN, vss, BBS= Clear.

## 2024-11-17 NOTE — Op Note (Signed)
 [facility] Patient Name: Nathan Meadows Procedure Date: 11/17/2024 3:18 PM MRN: [id number] Endoscopist: Lupita FORBES Commander , MD, [id number] Age: 68 Referring MD:  Date of Birth: [birth year] Gender: Male Account #: [id number] Procedure:                Colonoscopy Indications:              Screening for colorectal malignant neoplasm, Last                            colonoscopy: 2015 Medicines:                Monitored Anesthesia Care Procedure:                Pre-Anesthesia Assessment:                           - Prior to the procedure, a History and Physical                            was performed, and patient medications and                            allergies were reviewed. The patient's tolerance of                            previous anesthesia was also reviewed. The risks                            and benefits of the procedure and the sedation                            options and risks were discussed with the patient.                            All questions were answered, and informed consent                            was obtained. Prior Anticoagulants: The patient has                            taken no anticoagulant or antiplatelet agents. ASA                            Grade Assessment: II - A patient with mild systemic                            disease. After reviewing the risks and benefits,                            the patient was deemed in satisfactory condition to                            undergo the procedure.  After obtaining informed consent, the colonoscope                            was passed under direct vision. Throughout the                            procedure, the patient's blood pressure, pulse, and                            oxygen saturations were monitored continuously. The                            Olympus Scope [id number] was introduced through the                            anus and advanced to the the cecum,  identified by                            appendiceal orifice and ileocecal valve. The                            colonoscopy was performed without difficulty. The                            patient tolerated the procedure well. The quality                            of the bowel preparation was adequate. The                            ileocecal valve, appendiceal orifice, and rectum                            were photographed. The bowel preparation used was                            SUPREP via split dose instruction. Scope In: 3:33:35 PM Scope Out: 3:44:29 PM Scope Withdrawal Time: 0 hours 9 minutes 23 seconds  Total Procedure Duration: 0 hours 10 minutes 54 seconds  Findings:                 The perianal and digital rectal examinations were                            normal except for hemorrhoids. Pertinent negatives                            include normal prostate (size, shape, and                            consistency).                           Multiple diverticula were found in the sigmoid  colon, descending colon and ascending colon.                           External and internal hemorrhoids were found.                           The exam was otherwise without abnormality on                            direct and retroflexion views. Complications:            No immediate complications. Estimated Blood Loss:     Estimated blood loss: none. Impression:               - Diverticulosis in the sigmoid colon, in the                            descending colon and in the ascending colon.                           - External and internal hemorrhoids.                           - The examination was otherwise normal on direct                            and retroflexion views.                           - No specimens collected. Recommendation:           - Patient has a contact number available for                            emergencies. The signs and symptoms of  potential                            delayed complications were discussed with the                            patient. Return to normal activities tomorrow.                            Written discharge instructions were provided to the                            patient.                           - Resume previous diet.                           - Continue present medications.                           - No repeat colonoscopy due to current age (54  years or older) and the absence of colonic polyps. Lupita FORBES Commander, MD 11/17/2024 3:52:10 PM This report has been signed electronically.

## 2024-11-17 NOTE — Progress Notes (Signed)
 Pt's states no medical or surgical changes since previsit or office visit.

## 2024-11-18 ENCOUNTER — Telehealth: Payer: Self-pay | Admitting: Lactation Services

## 2024-11-18 NOTE — Telephone Encounter (Signed)
 No answer left voice mail
